# Patient Record
Sex: Female | Born: 1990 | Race: Black or African American | Hispanic: No | Marital: Married | Smoking: Never smoker
Health system: Southern US, Community
[De-identification: ages and names within clinical notes are randomized; demographics above are authoritative.]

## PROBLEM LIST (undated history)

## (undated) ENCOUNTER — Inpatient Hospital Stay (HOSPITAL_COMMUNITY): Payer: Self-pay

## (undated) DIAGNOSIS — R011 Cardiac murmur, unspecified: Secondary | ICD-10-CM

## (undated) DIAGNOSIS — Z3490 Encounter for supervision of normal pregnancy, unspecified, unspecified trimester: Secondary | ICD-10-CM

## (undated) DIAGNOSIS — R110 Nausea: Secondary | ICD-10-CM

## (undated) DIAGNOSIS — E559 Vitamin D deficiency, unspecified: Secondary | ICD-10-CM

## (undated) DIAGNOSIS — F32A Depression, unspecified: Secondary | ICD-10-CM

## (undated) DIAGNOSIS — Z8619 Personal history of other infectious and parasitic diseases: Secondary | ICD-10-CM

## (undated) DIAGNOSIS — F329 Major depressive disorder, single episode, unspecified: Secondary | ICD-10-CM

## (undated) DIAGNOSIS — F419 Anxiety disorder, unspecified: Secondary | ICD-10-CM

## (undated) HISTORY — DX: Personal history of other infectious and parasitic diseases: Z86.19

## (undated) HISTORY — DX: Depression, unspecified: F32.A

## (undated) HISTORY — DX: Anxiety disorder, unspecified: F41.9

## (undated) HISTORY — DX: Vitamin D deficiency, unspecified: E55.9

## (undated) HISTORY — DX: Major depressive disorder, single episode, unspecified: F32.9

---
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
## 2015-03-16 ENCOUNTER — Encounter (HOSPITAL_COMMUNITY): Payer: Self-pay | Admitting: Psychiatry

## 2015-03-16 ENCOUNTER — Ambulatory Visit (INDEPENDENT_AMBULATORY_CARE_PROVIDER_SITE_OTHER): Payer: 59 | Admitting: Psychiatry

## 2015-03-16 VITALS — BP 136/88 | HR 81 | Ht 67.0 in | Wt 186.4 lb

## 2015-03-16 DIAGNOSIS — F4310 Post-traumatic stress disorder, unspecified: Secondary | ICD-10-CM | POA: Diagnosis not present

## 2015-03-16 DIAGNOSIS — F902 Attention-deficit hyperactivity disorder, combined type: Secondary | ICD-10-CM | POA: Diagnosis not present

## 2015-03-16 DIAGNOSIS — F341 Dysthymic disorder: Secondary | ICD-10-CM | POA: Diagnosis not present

## 2015-03-16 MED ORDER — BUPROPION HCL ER (XL) 150 MG PO TB24: 150.0000 mg | ORAL_TABLET | Freq: Every day | ORAL | Status: DC
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
## 2015-04-06 ENCOUNTER — Ambulatory Visit (HOSPITAL_COMMUNITY): Payer: 59 | Admitting: Psychiatry

## 2015-04-27 ENCOUNTER — Ambulatory Visit (HOSPITAL_COMMUNITY): Payer: 59 | Admitting: Psychiatry

## 2015-05-05 ENCOUNTER — Ambulatory Visit (HOSPITAL_COMMUNITY): Payer: 59 | Admitting: Psychiatry

## 2015-05-06 ENCOUNTER — Encounter (HOSPITAL_COMMUNITY): Payer: Self-pay | Admitting: Psychiatry

## 2015-05-06 ENCOUNTER — Ambulatory Visit (INDEPENDENT_AMBULATORY_CARE_PROVIDER_SITE_OTHER): Payer: 59 | Admitting: Psychiatry

## 2015-05-06 VITALS — BP 114/74 | HR 62 | Ht 67.0 in | Wt 183.8 lb

## 2015-05-06 DIAGNOSIS — F341 Dysthymic disorder: Secondary | ICD-10-CM | POA: Diagnosis not present

## 2015-05-06 DIAGNOSIS — F902 Attention-deficit hyperactivity disorder, combined type: Secondary | ICD-10-CM | POA: Diagnosis not present

## 2015-05-06 DIAGNOSIS — F4310 Post-traumatic stress disorder, unspecified: Secondary | ICD-10-CM

## 2015-05-06 MED ORDER — METHYLPHENIDATE HCL ER (OSM) 18 MG PO TBCR: 18.0000 mg | EXTENDED_RELEASE_TABLET | Freq: Two times a day (BID) | ORAL | Status: DC
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
## 2015-05-20 ENCOUNTER — Ambulatory Visit (HOSPITAL_COMMUNITY): Payer: BLUE CROSS/BLUE SHIELD | Admitting: Psychiatry

## 2015-05-24 ENCOUNTER — Telehealth (HOSPITAL_COMMUNITY): Payer: Self-pay

## 2015-05-24 NOTE — Telephone Encounter (Signed)
Received a fax from [employer]  for the approval of Concerta  - approved through 05/19/2016

## 2015-06-07 ENCOUNTER — Ambulatory Visit (HOSPITAL_COMMUNITY): Payer: 59 | Admitting: Psychiatry

## 2015-06-09 ENCOUNTER — Encounter (HOSPITAL_COMMUNITY): Payer: Self-pay | Admitting: Psychiatry

## 2015-06-09 ENCOUNTER — Ambulatory Visit (INDEPENDENT_AMBULATORY_CARE_PROVIDER_SITE_OTHER): Payer: BLUE CROSS/BLUE SHIELD | Admitting: Psychiatry

## 2015-06-09 VITALS — BP 108/72 | HR 76 | Ht 67.0 in | Wt 186.2 lb

## 2015-06-09 DIAGNOSIS — F4310 Post-traumatic stress disorder, unspecified: Secondary | ICD-10-CM

## 2015-06-09 DIAGNOSIS — F902 Attention-deficit hyperactivity disorder, combined type: Secondary | ICD-10-CM

## 2015-06-09 DIAGNOSIS — F341 Dysthymic disorder: Secondary | ICD-10-CM

## 2015-06-09 MED ORDER — METHYLPHENIDATE HCL ER (OSM) 18 MG PO TBCR: 18.0000 mg | EXTENDED_RELEASE_TABLET | Freq: Two times a day (BID) | ORAL | Status: DC
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
## 2015-06-28 ENCOUNTER — Ambulatory Visit (HOSPITAL_COMMUNITY): Payer: BLUE CROSS/BLUE SHIELD | Admitting: Psychiatry

## 2015-07-12 ENCOUNTER — Ambulatory Visit (HOSPITAL_COMMUNITY): Payer: BLUE CROSS/BLUE SHIELD | Admitting: Psychiatry

## 2015-08-13 ENCOUNTER — Encounter (HOSPITAL_COMMUNITY): Payer: Self-pay | Admitting: Psychiatry

## 2015-08-13 ENCOUNTER — Encounter (HOSPITAL_COMMUNITY): Payer: Self-pay | Admitting: *Deleted

## 2015-08-13 ENCOUNTER — Emergency Department (HOSPITAL_COMMUNITY)
Admission: EM | Admit: 2015-08-13 | Discharge: 2015-08-13 | Disposition: A | Discharge status: Home / Self Care | Payer: BLUE CROSS/BLUE SHIELD | Attending: Emergency Medicine | Admitting: Emergency Medicine

## 2015-08-13 DIAGNOSIS — Y92410 Unspecified street and highway as the place of occurrence of the external cause: Secondary | ICD-10-CM | POA: Diagnosis not present

## 2015-08-13 DIAGNOSIS — Y9389 Activity, other specified: Secondary | ICD-10-CM | POA: Insufficient documentation

## 2015-08-13 DIAGNOSIS — Y998 Other external cause status: Secondary | ICD-10-CM | POA: Insufficient documentation

## 2015-08-13 DIAGNOSIS — S0990XA Unspecified injury of head, initial encounter: Secondary | ICD-10-CM | POA: Diagnosis not present

## 2015-08-13 DIAGNOSIS — S299XXA Unspecified injury of thorax, initial encounter: Secondary | ICD-10-CM | POA: Insufficient documentation

## 2015-08-13 DIAGNOSIS — M546 Pain in thoracic spine: Secondary | ICD-10-CM

## 2015-08-13 MED ORDER — METHOCARBAMOL 500 MG PO TABS: 500.0000 mg | ORAL_TABLET | Freq: Two times a day (BID) | ORAL | Status: DC

## 2015-08-13 MED ORDER — IBUPROFEN 800 MG PO TABS: 800.0000 mg | ORAL_TABLET | Freq: Three times a day (TID) | ORAL | Status: DC

## 2015-08-13 NOTE — ED Notes (Signed)
Pt. with her daughter at pediatric  [facility] .

## 2015-08-13 NOTE — ED Notes (Signed)
Unable to locate pt. at waiting area twice .
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
## 2015-09-06 ENCOUNTER — Ambulatory Visit (HOSPITAL_COMMUNITY): Payer: BLUE CROSS/BLUE SHIELD | Admitting: Psychiatry

## 2016-12-03 ENCOUNTER — Emergency Department (HOSPITAL_COMMUNITY)
Admission: EM | Admit: 2016-12-03 | Discharge: 2016-12-03 | Disposition: A | Discharge status: Home / Self Care | Payer: BLUE CROSS/BLUE SHIELD | Attending: Emergency Medicine | Admitting: Emergency Medicine

## 2016-12-03 ENCOUNTER — Encounter (HOSPITAL_COMMUNITY): Payer: Self-pay | Admitting: Emergency Medicine

## 2016-12-03 DIAGNOSIS — N912 Amenorrhea, unspecified: Secondary | ICD-10-CM

## 2016-12-03 DIAGNOSIS — N898 Other specified noninflammatory disorders of vagina: Secondary | ICD-10-CM | POA: Insufficient documentation

## 2016-12-03 DIAGNOSIS — Z79899 Other long term (current) drug therapy: Secondary | ICD-10-CM | POA: Insufficient documentation

## 2016-12-03 LAB — COMPREHENSIVE METABOLIC PANEL
ALBUMIN: 4 g/dL (ref 3.5–5.0)
ALT: 16 U/L (ref 14–54)
AST: 23 U/L (ref 15–41)
Alkaline Phosphatase: 66 U/L (ref 38–126)
Anion gap: 8 (ref 5–15)
BUN: 14 mg/dL (ref 6–20)
CHLORIDE: 105 mmol/L (ref 101–111)
CO2: 26 mmol/L (ref 22–32)
Calcium: 9.4 mg/dL (ref 8.9–10.3)
Creatinine, Ser: 0.97 mg/dL (ref 0.44–1.00)
GFR calc Af Amer: >60 mL/min (ref >60)
GLUCOSE: 81 mg/dL (ref 65–99)
POTASSIUM: 3.6 mmol/L (ref 3.5–5.1)
SODIUM: 139 mmol/L (ref 135–145)
Total Bilirubin: 0.4 mg/dL (ref 0.3–1.2)
Total Protein: 7.2 g/dL (ref 6.5–8.1)

## 2016-12-03 LAB — WET PREP, GENITAL
CLUE CELLS WET PREP: NONE SEEN
Sperm: NONE SEEN
Trich, Wet Prep: NONE SEEN
WBC WET PREP: NONE SEEN
YEAST WET PREP: NONE SEEN

## 2016-12-03 LAB — URINALYSIS, ROUTINE W REFLEX MICROSCOPIC
BACTERIA UA: NONE SEEN
Bilirubin Urine: NEGATIVE
Glucose, UA: NEGATIVE mg/dL
KETONES UR: NEGATIVE mg/dL
LEUKOCYTES UA: NEGATIVE
Nitrite: NEGATIVE
PH: 6 (ref 5.0–8.0)
Protein, ur: NEGATIVE mg/dL
Specific Gravity, Urine: 1.018 (ref 1.005–1.030)

## 2016-12-03 LAB — CBC WITH DIFFERENTIAL/PLATELET
BASOS ABS: 0 10*3/uL (ref 0.0–0.1)
BASOS PCT: 0 %
EOS ABS: 0.5 10*3/uL (ref 0.0–0.7)
EOS PCT: 5 %
HCT: 41.5 % (ref 36.0–46.0)
Hemoglobin: 14.4 g/dL (ref 12.0–15.0)
Lymphocytes Relative: 33 %
Lymphs Abs: 3.5 10*3/uL (ref 0.7–4.0)
MCH: 29.9 pg (ref 26.0–34.0)
MCHC: 34.7 g/dL (ref 30.0–36.0)
MCV: 86.3 fL (ref 78.0–100.0)
MONO ABS: 1 10*3/uL (ref 0.1–1.0)
Monocytes Relative: 10 %
Neutro Abs: 5.7 10*3/uL (ref 1.7–7.7)
Neutrophils Relative %: 52 %
PLATELETS: 300 10*3/uL (ref 150–400)
RBC: 4.81 MIL/uL (ref 3.87–5.11)
RDW: 12.5 % (ref 11.5–15.5)
WBC: 10.8 10*3/uL — AB (ref 4.0–10.5)

## 2016-12-03 LAB — I-STAT BETA HCG BLOOD, ED (MC, WL, AP ONLY)

## 2016-12-03 NOTE — ED Triage Notes (Signed)
Pt c/o vaginal discharge. Pt states she thinks she is 4 months pregnant, discharge is more than normal. Pt states she has only had negative pregnancy tests. Pt states she has symptoms similar to when she was pregnant before.

## 2016-12-03 NOTE — ED Provider Notes (Signed)
[facility]-EMERGENCY DEPT Provider Note   CSN: [id number] Arrival date & time: 12/03/16  1313     History   Chief Complaint Chief Complaint  Patient presents with  . Possible Pregnancy  . Vaginal Discharge    HPI Jill Aguilar is a 26 y.o. female.  HPI  Jill Aguilar is a 26 y.o. female with a history of anxiety and depression, presents to emergency department complaining of abdominal pain, nausea, vaginal discharge,breast tenderness. Patient states she had all of these symptoms when she was pregnant with her daughter. She states she took a pregnancy test at home and was negative. She reports that her last normal menstrual cycle was in April and she believes she might be 4 months pregnant. She reports intermittent abdominal cramping. No bleeding. No urinary symptoms. No fever, chills, malaise. Nothing making her symptoms better or worse. No other complaints. Does not have PCP or OB/GYN  Past Medical History:  Diagnosis Date  . Anxiety   . Depression     Patient Active Problem List   Diagnosis Date Noted  . 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 PTSD (post-traumatic stress disorder) 03/16/2015  . 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 CESAREAN SECTION    . WISDOM TOOTH EXTRACTION Bilateral 10/2002    OB History    Gravida Para Term Preterm AB Living   0 0 0 0 0     SAB TAB Ectopic Multiple Live Births   0 0 0           Home Medications    Prior to Admission medications   Medication Sig Start Date End Date Taking? Authorizing Provider  ibuprofen (ADVIL,MOTRIN) 800 MG tablet Take 1 tablet (800 mg total) by mouth 3 (three) times daily. 08/13/15   Elpidio Anis, PA-C  methocarbamol (ROBAXIN) 500 MG tablet Take 1 tablet (500 mg total) by mouth 2 (two) times daily. 08/13/15   Elpidio Anis, PA-C  methylphenidate (CONCERTA) 18 MG PO CR tablet Take 1 tablet (18 mg total) by  mouth 2 (two) times daily with breakfast and lunch. 06/09/15 06/08/16  Gayland Curry, MD    Family History History reviewed. No pertinent family history.  Social History Social History  Substance Use Topics  . Smoking status: Never Smoker  . Smokeless tobacco: Never Used  . Alcohol use 3.6 oz/week    2 Glasses of wine, 2 Cans of beer, 2 Shots of liquor per week     Allergies   Patient has no known allergies.   Review of Systems Review of Systems  Constitutional: Negative for chills and fever.  Respiratory: Negative for cough, chest tightness and shortness of breath.   Cardiovascular: Negative for chest pain, palpitations and leg swelling.  Gastrointestinal: Positive for abdominal pain and nausea. Negative for diarrhea and vomiting.  Genitourinary: Positive for pelvic pain and vaginal discharge. Negative for dysuria, flank pain, vaginal bleeding and vaginal pain.  Musculoskeletal: Negative for arthralgias, myalgias, neck pain and neck stiffness.  Skin: Negative for rash.  Neurological: Negative for dizziness, weakness and headaches.  All other systems reviewed and are negative.    Physical Exam Updated Vital Signs BP (!) 143/87 (BP Location: Left Arm)   Pulse 89   Temp 97.8 F (36.6 C) (Oral)   Resp 16   LMP 07/18/2016 (Exact Date)   SpO2 99%   Physical Exam  Constitutional: She appears well-developed and well-nourished. No distress.  HENT:  Head: Normocephalic.  Eyes: Conjunctivae are normal.  Neck: Neck supple.  Cardiovascular: Normal rate, regular rhythm and normal heart sounds.   Pulmonary/Chest: Effort normal and breath sounds normal. No respiratory distress. She has no wheezes. She has no rales.  Abdominal: Soft. Bowel sounds are normal. She exhibits no distension. There is no tenderness. There is no rebound.  Genitourinary:  Genitourinary Comments: Normal external genitalia. Normal vaginal canal. Small thin white discharge. Cervix is normal, closed.  No CMT. No uterine or adnexal tenderness. No masses palpated.    Musculoskeletal: She exhibits no edema.  Neurological: She is alert.  Skin: Skin is warm and dry.  Psychiatric: She has a normal mood and affect. Her behavior is normal.  Nursing note and vitals reviewed.    ED Treatments / Results  Labs (all labs ordered are listed, but only abnormal results are displayed) Labs Reviewed  CBC WITH DIFFERENTIAL/PLATELET - Abnormal; Notable for the following:       Result Value   WBC 10.8 (*)    All other components within normal limits  URINALYSIS, ROUTINE W REFLEX MICROSCOPIC - Abnormal; Notable for the following:    Hgb urine dipstick MODERATE (*)    Squamous Epithelial / LPF 0-5 (*)    All other components within normal limits  WET PREP, GENITAL  COMPREHENSIVE METABOLIC PANEL  I-STAT BETA HCG BLOOD, ED ([facility], [facility], [facility] ONLY)  GC/CHLAMYDIA PROBE AMP ([place]) NOT AT [facility]    EKG  EKG Interpretation None       Radiology No results found.  Procedures Procedures (including critical care time)  Medications Ordered in ED Medications - No data to display   Initial Impression / Assessment and Plan / ED Course  I have reviewed the triage vital signs and the nursing notes.  Pertinent labs & imaging results that were available during my care of the patient were reviewed by me and considered in my medical decision making (see chart for details).     Into the emergency department with multiple complaints. She believes she maybe 4 months pregnant because she has not had a period since April. She reports some abdominal cramping that is intermittent, reports nausea, breast tenderness, she feels fluttering in her stomach. Patient has a laptop with her and pulls out a list of her symptoms that she typed out. I will check pregnancy test, basic labs perform pelvic exam.  5:13 PM Patient is not pregnant. Her pelvic exam is unremarkable. Labs are all normal. Urinalysis normal.discussed  test results with patient. Will have her follow with family doctor for further evaluation and treatment of her symptoms. At time of discharge patient is in no acute distress, vital signs are normal.  Vitals:   12/03/16 1410 12/03/16 1627  BP: (!) 143/87 126/80  Pulse: 89 75  Resp: 16 16  Temp: 97.8 F (36.6 C)   TempSrc: Oral   SpO2: 99% 98%     Final Clinical Impressions(s) / ED Diagnoses   Final diagnoses:  Amenorrhea  Vaginal discharge    New Prescriptions New Prescriptions   No medications on file     Jaynie Crumble, Cordelia Poche 12/03/16 1727    Pricilla Loveless, MD 12/05/16 1222

## 2016-12-03 NOTE — Discharge Instructions (Signed)
Your blood pregnancy levels are negative today. Your lab work and exam are unremarkable. Follow up as needed. Return if worsening.

## 2016-12-04 LAB — GC/CHLAMYDIA PROBE AMP (~~LOC~~) NOT AT ARMC
Chlamydia: NEGATIVE
Neisseria Gonorrhea: NEGATIVE

## 2017-02-11 ENCOUNTER — Emergency Department
Admission: EM | Admit: 2017-02-11 | Discharge: 2017-02-11 | Disposition: A | Discharge status: Home / Self Care | Payer: Self-pay | Attending: Emergency Medicine | Admitting: Emergency Medicine

## 2017-02-11 ENCOUNTER — Emergency Department: Payer: Self-pay

## 2017-02-11 ENCOUNTER — Encounter: Payer: Self-pay | Admitting: Radiology

## 2017-02-11 DIAGNOSIS — F902 Attention-deficit hyperactivity disorder, combined type: Secondary | ICD-10-CM | POA: Insufficient documentation

## 2017-02-11 DIAGNOSIS — R1012 Left upper quadrant pain: Secondary | ICD-10-CM | POA: Insufficient documentation

## 2017-02-11 DIAGNOSIS — F329 Major depressive disorder, single episode, unspecified: Secondary | ICD-10-CM | POA: Insufficient documentation

## 2017-02-11 DIAGNOSIS — R52 Pain, unspecified: Secondary | ICD-10-CM

## 2017-02-11 DIAGNOSIS — F419 Anxiety disorder, unspecified: Secondary | ICD-10-CM | POA: Insufficient documentation

## 2017-02-11 DIAGNOSIS — K8010 Calculus of gallbladder with chronic cholecystitis without obstruction: Secondary | ICD-10-CM | POA: Insufficient documentation

## 2017-02-11 DIAGNOSIS — Z79899 Other long term (current) drug therapy: Secondary | ICD-10-CM | POA: Insufficient documentation

## 2017-02-11 LAB — URINALYSIS, COMPLETE (UACMP) WITH MICROSCOPIC
Bacteria, UA: NONE SEEN
Bilirubin Urine: NEGATIVE
GLUCOSE, UA: NEGATIVE mg/dL
Ketones, ur: NEGATIVE mg/dL
LEUKOCYTES UA: NEGATIVE
NITRITE: NEGATIVE
PROTEIN: NEGATIVE mg/dL
SPECIFIC GRAVITY, URINE: 1.023 (ref 1.005–1.030)
pH: 6 (ref 5.0–8.0)

## 2017-02-11 LAB — COMPREHENSIVE METABOLIC PANEL
ALT: 16 U/L (ref 14–54)
AST: 20 U/L (ref 15–41)
Albumin: 4 g/dL (ref 3.5–5.0)
Alkaline Phosphatase: 64 U/L (ref 38–126)
Anion gap: 10 (ref 5–15)
BILIRUBIN TOTAL: 0.6 mg/dL (ref 0.3–1.2)
BUN: 16 mg/dL (ref 6–20)
CALCIUM: 9.3 mg/dL (ref 8.9–10.3)
CHLORIDE: 105 mmol/L (ref 101–111)
CO2: 24 mmol/L (ref 22–32)
CREATININE: 0.84 mg/dL (ref 0.44–1.00)
Glucose, Bld: 109 mg/dL — ABNORMAL HIGH (ref 65–99)
Potassium: 3.8 mmol/L (ref 3.5–5.1)
Sodium: 139 mmol/L (ref 135–145)
TOTAL PROTEIN: 7.9 g/dL (ref 6.5–8.1)

## 2017-02-11 LAB — CBC
HCT: 43.3 % (ref 35.0–47.0)
Hemoglobin: 14.3 g/dL (ref 12.0–16.0)
MCH: 29.2 pg (ref 26.0–34.0)
MCHC: 33 g/dL (ref 32.0–36.0)
MCV: 88.6 fL (ref 80.0–100.0)
PLATELETS: 335 10*3/uL (ref 150–440)
RBC: 4.88 MIL/uL (ref 3.80–5.20)
RDW: 13.1 % (ref 11.5–14.5)
WBC: 11.8 10*3/uL — AB (ref 3.6–11.0)

## 2017-02-11 LAB — POCT PREGNANCY, URINE: Preg Test, Ur: NEGATIVE

## 2017-02-11 LAB — LIPASE, BLOOD: LIPASE: 25 U/L (ref 11–51)

## 2017-02-11 MED ORDER — FAMOTIDINE 20 MG PO TABS: 20.0000 mg | ORAL_TABLET | Freq: Two times a day (BID) | ORAL | 0 refills | Status: DC

## 2017-02-11 MED ORDER — HYDROCOD POLST-CPM POLST ER 10-8 MG/5ML PO SUER
5.0000 mL | Freq: Once | ORAL | Status: AC
  Administered 2017-02-11: 5 mL via ORAL
  Filled 2017-02-11: qty 5

## 2017-02-11 MED ORDER — GI COCKTAIL ~~LOC~~
30.0000 mL | Freq: Once | ORAL | Status: AC
  Administered 2017-02-11: 30 mL via ORAL
  Filled 2017-02-11: qty 30

## 2017-02-11 MED ORDER — KETOROLAC TROMETHAMINE 30 MG/ML IJ SOLN
15.0000 mg | Freq: Once | INTRAMUSCULAR | Status: AC
  Administered 2017-02-11: 15 mg via INTRAVENOUS
  Filled 2017-02-11: qty 1

## 2017-02-11 MED ORDER — IOPAMIDOL (ISOVUE-300) INJECTION 61%
100.0000 mL | Freq: Once | INTRAVENOUS | Status: AC | PRN
  Administered 2017-02-11: 100 mL via INTRAVENOUS

## 2017-02-11 MED ORDER — HYDROCODONE-ACETAMINOPHEN 5-325 MG PO TABS
1.0000 | ORAL_TABLET | Freq: Once | ORAL | Status: AC
  Administered 2017-02-11: 1 via ORAL
  Filled 2017-02-11: qty 1

## 2017-02-11 NOTE — ED Provider Notes (Signed)
[facility] Emergency Department Provider Note  ____________________________________________   First MD Initiated Contact with Patient 02/11/17 1101     (approximate)  I have reviewed the triage vital signs and the nursing notes.   HISTORY  Chief Complaint Abdominal Pain   HPI Jill Aguilar is a 26 y.o. female who self presents to the emergency department with multiple complaints.  She is primarily asking for a pregnancy test because she is concerned she may be pregnant.  For the past several months she has had intermittent symptoms including breast fullness, chest pain, vaginal spotting, and left flank pain.  She has had multiple negative pregnancy tests and has seen several physicians who have always reported negative pregnancy test.  She has a past medical history of polycystic ovarian syndrome diagnosed via biopsy as a teenager but is currently not being treated.  Ever since her symptoms began she has not had an ultrasound or CT scan.  She comes to the emergency department today primarily with 2 issues.  The first is constant aching and throbbing left flank pain.  She also has intermittent epigastric and lower chest discomfort.  She does have a long-standing history of gastric reflux, however this time it is not responding to her normal treatments.  Her flank pain is constant moderate cramping.  Nothing seems to make it better or worse.  Past Medical History:  Diagnosis Date  . Anxiety   . Depression     Patient Active Problem List   Diagnosis Date Noted  . 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 PTSD (post-traumatic stress disorder) 03/16/2015  . 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 CESAREAN SECTION    . WISDOM TOOTH EXTRACTION Bilateral 10/2002    Prior to Admission medications   Medication Sig Start Date End Date Taking? Authorizing Provider  [facility] JOHNS  WORT PO Take 1 capsule by mouth daily.   Yes [provider]  famotidine (PEPCID) 20 MG tablet Take 1 tablet (20 mg total) by mouth 2 (two) times daily. 02/11/17 02/11/18  Merrily Brittleifenbark, Keaunna Skipper, MD  ibuprofen (ADVIL,MOTRIN) 800 MG tablet Take 1 tablet (800 mg total) by mouth 3 (three) times daily. Patient not taking: Reported on 02/11/2017 08/13/15   Elpidio AnisUpstill, Shari, PA-C  methocarbamol (ROBAXIN) 500 MG tablet Take 1 tablet (500 mg total) by mouth 2 (two) times daily. Patient not taking: Reported on 02/11/2017 08/13/15   Elpidio AnisUpstill, Shari, PA-C  methylphenidate (CONCERTA) 18 MG PO CR tablet Take 1 tablet (18 mg total) by mouth 2 (two) times daily with breakfast and lunch. Patient not taking: Reported on 02/11/2017 06/09/15 06/08/16  Gayland Curryadepalli, Gayathri D, MD    Allergies Patient has no known allergies.  No family history on file.  Social History Social History  Substance Use Topics  . Smoking status: Never Smoker  . Smokeless tobacco: Never Used  . Alcohol use 3.6 oz/week    2 Glasses of wine, 2 Cans of beer, 2 Shots of liquor per week    Review of Systems Constitutional: No fever/chills Eyes: No visual changes. ENT: No sore throat. Cardiovascular: Positive for chest pain. Respiratory: Denies shortness of breath. Gastrointestinal: Positive for abdominal pain.  Positive for nausea, no vomiting.  No diarrhea.  No constipation. Genitourinary: Negative for dysuria. Musculoskeletal: Negative for back pain. Skin: Negative for rash. Neurological: Negative for headaches, focal weakness or numbness.   ____________________________________________   PHYSICAL EXAM:  VITAL SIGNS: ED Triage Vitals  Enc Vitals Group     BP 02/11/17 0903 (!) 141/93     Pulse Rate 02/11/17 0903 87     Resp 02/11/17 0903 16     Temp 02/11/17 0903 98.7 F (37.1 C)     Temp Source 02/11/17 0903 Oral     SpO2 02/11/17 0903 100 %     Weight 02/11/17 0903 190 lb (86.2 kg)     Height 02/11/17 0903 5\' 7"   (1.702 m)     Head Circumference --      Peak Flow --      Pain Score 02/11/17 0911 8     Pain Loc --      Pain Edu? --      Excl. in GC? --     Constitutional: Alert and oriented x4 appears somewhat uncomfortable splinting her left flank no diaphoresis speaks in full clear sentences Eyes: PERRL EOMI. Head: Atraumatic. Nose: No congestion/rhinnorhea. Mouth/Throat: No trismus Neck: No stridor.   Cardiovascular: Normal rate, regular rhythm. Grossly normal heart sounds.  Good peripheral circulation. Respiratory: Normal respiratory effort.  No retractions. Lungs CTAB and moving good air Gastrointestinal: Soft nondistended she is tender in her left upper quadrant along the left flank with no rebound or guarding or peritonitis Musculoskeletal: No lower extremity edema   Neurologic:  Normal speech and language. No gross focal neurologic deficits are appreciated. Skin:  Skin is warm, dry and intact. No rash noted. Psychiatric: Mood and affect are normal. Speech and behavior are normal.    ____________________________________________   DIFFERENTIAL includes but not limited to  Kidney stone, pyelonephritis, polycystic ovarian syndrome, pneumonia ____________________________________________   LABS (all labs ordered are listed, but only abnormal results are displayed)  Labs Reviewed  COMPREHENSIVE METABOLIC PANEL - Abnormal; Notable for the following:       Result Value   Glucose, Bld 109 (*)    All other components within normal limits  CBC - Abnormal; Notable for the following:    WBC 11.8 (*)    All other components within normal limits  URINALYSIS, COMPLETE (UACMP) WITH MICROSCOPIC - Abnormal; Notable for the following:    Color, Urine YELLOW (*)    APPearance CLEAR (*)    Hgb urine dipstick MODERATE (*)    Squamous Epithelial / LPF 0-5 (*)    All other components within normal limits  LIPASE, BLOOD  POC URINE PREG, ED  POCT PREGNANCY, URINE    Blood work reviewed and  interpreted by me with no evidence of urinary tract infection and not pregnant __________________________________________  EKG  ED ECG REPORT I, Merrily Brittle, the attending physician, personally viewed and interpreted this ECG.  Date: 02/11/2017 EKG Time:  Rate: 90 Rhythm: normal sinus rhythm QRS Axis: normal Intervals: normal ST/T Wave abnormalities: normal Narrative Interpretation: no evidence of acute ischemia  ____________________________________________  RADIOLOGY  CT scan abdomen pelvis concerning for acute cholecystitis Ultrasound right upper quadrant reviewed by me concerning for acute cholecystitis ____________________________________________   PROCEDURES  Procedure(s) performed: no  Procedures  Critical Care performed: no  Observation: no ____________________________________________   INITIAL IMPRESSION / ASSESSMENT AND PLAN / ED COURSE  Pertinent labs & imaging results that were available during my care of the patient were reviewed by me and considered in my medical decision making (see chart for details).  The patient arrives hemodynamically stable although somewhat uncomfortable appearing.  Her symptoms are subacute, however are clearly worsening.  She is quite tender in her left upper quadrant and left flank  raising concern for undiagnosed nephrolithiasis versus other intra-abdominal pathology.  At this point the patient requires a CT scan with IV contrast in addition to a chest x-ray for her atypical chest pain.     ----------------------------------------- 1:11 PM on 02/11/2017 ----------------------------------------- The patient feels improved.  Her CT scan is back concerning actually for acute cholecystitis.  Her clinical picture does not fit this with left upper quadrant pain that is been chronic for some time.  I had a lengthy discussion with the patient regarding the need for right upper quadrant ultrasound today and she agrees to stay.  I am  not consulting surgery now as she is afebrile, pain controlled, normal labs, and has left upper quadrant pain.  __________________________  ----------------------------------------- 2:34 PM on 02/11/2017 -----------------------------------------  The patient's ultrasound has a negative Murphy's but does have gallbladder wall thickening and is concerning for possible cholecystitis.  I have touched base with on-call general surgeon Dr. Earlene Plater who will kindly come evaluate the patient now.  __________________    ----------------------------------------- 3:19 PM on 02/11/2017 -----------------------------------------  Dr. Earlene Plater evaluated the patient feels that the patient does not have acute cholecystitis and that her symptoms are likely related to reflux.  The imaging is likely incidental.  Patient is able to eat and drink without difficulty.  Strict return precautions have been given and the patient is discharged home in improved condition.  She verbalizes understanding and agreement the plan.  FINAL CLINICAL IMPRESSION(S) / ED DIAGNOSES  Final diagnoses:  Pain  Left upper quadrant pain      NEW MEDICATIONS STARTED DURING THIS VISIT:  New Prescriptions   FAMOTIDINE (PEPCID) 20 MG TABLET    Take 1 tablet (20 mg total) by mouth 2 (two) times daily.     Note:  This document was prepared using Dragon voice recognition software and may include unintentional dictation errors.     Merrily Brittle, MD 02/11/17 [phone]

## 2017-02-11 NOTE — Consult Note (Signed)
SURGICAL CONSULTATION NOTE (initial) - cpt: 14782  HISTORY OF PRESENT ILLNESS (HPI):  26 y.o. female presented to [facility] ED for evaluation of Left back >> epigastric abdominal pain x 10 days. Patient reports a history of chronic GERD that she has historically self-treated with baking soda solution, but that did not provide her relief during this episode. She presented to an urgent care facility, which advised omeprazole, but patient said she doesn't like taking medications and has not yet tried this management. Otherwise, patient states her Left back and epigastric pain are both tolerable during the day and worse when she lays down at night, becomes worst ~3:30 am every night, unrelated to eating or what she eats/drinks. She clearly and consistently strongly denies RUQ abdominal pain or association with fatty foods. She also reports +flatus and +BM, denies N/V, fever/chills, CP, or SOB.  Surgery is consulted by ED physician Dr. Lamont Snowball in this context for evaluation and management of cholelithiasis.  PAST MEDICAL HISTORY (PMH):  Past Medical History:  Diagnosis Date  . Anxiety   . Depression      PAST SURGICAL HISTORY (PSH):  Past Surgical History:  Procedure Laterality Date  . CESAREAN SECTION    . WISDOM TOOTH EXTRACTION Bilateral 10/2002     MEDICATIONS:  Prior to Admission medications   Medication Sig Start Date End Date Taking? Authorizing Provider  [facility] J[facility] WORT PO Take 1 capsule by mouth daily.   Yes [provider]  famotidine (PEPCID) 20 MG tablet Take 1 tablet (20 mg total) by mouth 2 (two) times daily. 02/11/17 02/11/18  Merrily Brittle, MD  ibuprofen (ADVIL,MOTRIN) 800 MG tablet Take 1 tablet (800 mg total) by mouth 3 (three) times daily. Patient not taking: Reported on 02/11/2017 08/13/15   Elpidio Anis, PA-C  methocarbamol (ROBAXIN) 500 MG tablet Take 1 tablet (500 mg total) by mouth 2 (two) times daily. Patient not taking: Reported on 02/11/2017 08/13/15    Elpidio Anis, PA-C  methylphenidate (CONCERTA) 18 MG PO CR tablet Take 1 tablet (18 mg total) by mouth 2 (two) times daily with breakfast and lunch. Patient not taking: Reported on 02/11/2017 06/09/15 06/08/16  Gayland Curry, MD     ALLERGIES:  No Known Allergies   SOCIAL HISTORY:  Social History   Social History  . Marital status: Single    Spouse name: N/A  . Number of children: N/A  . Years of education: N/A   Occupational History  . Not on file.   Social History Main Topics  . Smoking status: Never Smoker  . Smokeless tobacco: Never Used  . Alcohol use 3.6 oz/week    2 Glasses of wine, 2 Cans of beer, 2 Shots of liquor per week  . Drug use: Yes    Frequency: 1.0 time per week    Types: Marijuana  . Sexual activity: Yes    Birth control/ protection: None   Other Topics Concern  . Not on file   Social History Narrative   ** Merged History Encounter **        The patient currently resides (home / rehab facility / nursing home): Home The patient normally is (ambulatory / bedbound): Ambulatory   FAMILY HISTORY:  No family history on file.   REVIEW OF SYSTEMS:  Constitutional: denies weight loss, fever, chills, or sweats  Eyes: denies any other vision changes, history of eye injury  ENT: denies sore throat, hearing problems  Respiratory: denies shortness of breath, wheezing  Cardiovascular: denies chest pain, palpitations  Gastrointestinal: abdominal pain, N/V, and bowel function as per HPI Genitourinary: denies burning with urination or urinary frequency Musculoskeletal: denies any other joint pains or cramps  Skin: denies any other rashes or skin discolorations  Neurological: denies any other headache, dizziness, weakness  Psychiatric: denies any other depression, anxiety   All other review of systems were negative   VITAL SIGNS:  Temp:  [98.7 F (37.1 C)] 98.7 F (37.1 C) (10/28 0903) Pulse Rate:  [61-137] 61 (10/28 1530) Resp:  [16-18] 18  (10/28 1430) BP: (124-141)/(76-107) 125/88 (10/28 1530) SpO2:  [82 %-100 %] 100 % (10/28 1530) Weight:  [190 lb (86.2 kg)] 190 lb (86.2 kg) (10/28 0903)     Height: 5\' 7"  (170.2 cm) Weight: 190 lb (86.2 kg) BMI (Calculated): 29.75   INTAKE/OUTPUT:  This shift: No intake/output data recorded.  Last 2 shifts: @IOLAST2SHIFTS @   PHYSICAL EXAM:  Constitutional:  -- Obese body habitus  -- Awake, alert, and oriented x3  Eyes:  -- Pupils equally round and reactive to light  -- No scleral icterus  Ear, nose, and throat:  -- No jugular venous distension  Pulmonary:  -- No crackles  -- Equal breath sounds bilaterally -- Breathing non-labored at rest Cardiovascular:  -- S1, S2 present  -- No pericardial rubs Gastrointestinal:  -- Abdomen soft, obese, and non-distended with mild epigastric abdominal tenderness to palpation, no guarding or rebound tenderness -- No abdominal masses appreciated, pulsatile or otherwise  Musculoskeletal and Integumentary:  -- Wounds or skin discoloration: None appreciated -- Extremities: B/L UE and LE FROM, hands and feet warm, no edema  Neurologic:  -- Motor function: intact and symmetric -- Sensation: intact and symmetric  Labs:  CBC Latest Ref Rng & Units 02/11/2017 12/03/2016  WBC 3.6 - 11.0 K/uL 11.8(H) 10.8(H)  Hemoglobin 12.0 - 16.0 g/dL 16.114.3 09.614.4  Hematocrit 04.535.0 - 47.0 % 43.3 41.5  Platelets 150 - 440 K/uL 335 300   CMP Latest Ref Rng & Units 02/11/2017 12/03/2016  Glucose 65 - 99 mg/dL 409(W109(H) 81  BUN 6 - 20 mg/dL 16 14  Creatinine 1.190.44 - 1.00 mg/dL 1.470.84 8.290.97  Sodium 562135 - 145 mmol/L 139 139  Potassium 3.5 - 5.1 mmol/L 3.8 3.6  Chloride 101 - 111 mmol/L 105 105  CO2 22 - 32 mmol/L 24 26  Calcium 8.9 - 10.3 mg/dL 9.3 9.4  Total Protein 6.5 - 8.1 g/dL 7.9 7.2  Total Bilirubin 0.3 - 1.2 mg/dL 0.6 0.4  Alkaline Phos 38 - 126 U/L 64 66  AST 15 - 41 U/L 20 23  ALT 14 - 54 U/L 16 16   Imaging studies:  Limited RUQ Abdominal Ultrasound  (02/11/2017) Cholelithiasis and gallbladder wall thickening. Minimal pericholecystic fluid not excluded. No Murphy's sign. Acute cholecystitis is not excluded. If there is clinical ambiguity, recommend a HIDA scan.  CT Abdomen and Pelvis with Contrast (02/11/2017) No hepatic masses identified. Multiple cholesterol gallstones are seen and there is diffuse gallbladder wall thickening and mucosal enhancement, highly suspicious for acute cholecystitis. No evidence of biliary ductal dilatation.  Assessment/Plan: (ICD-10's: K80.20 vs K80.10) 26 y.o. female with uncontrolled GERD associated with Left back pain and asymptomatic cholelithiasis vs chronic cholecystitis, complicated by pertinent comorbidities including obesity (BMI 30), generalized anxiety disorder, and major depression disorder.   - agree with PPI, discussed with patient, who agrees to trial   - considering lack of symptoms consistent with acute cholecystitis, no indication for emergent cholecystectomy  - however, outpatient surgery follow-up this week advised  considering imaging findings, mild leukocytosis, and non-post-prandial epigastric component of patient's symptoms  - avoid fatty foods and morphine derivatives (morphine, dilaudid, oxycodone) if found to make pain worse  All of the above findings and recommendations were discussed with the patient and her mom (bedside), and all of patient's and her family's questions were answered to their expressed satisfaction.  Thank you for the opportunity to participate in this patient's care.   -- Scherrie Gerlach Earlene Plater, MD, RPVI [place]: [facility] General Surgery - Partnering for exceptional care. Office: [phone]

## 2017-02-11 NOTE — ED Triage Notes (Signed)
PT reports having symptoms of pregnancy off and on since May, she has had multiple negative preg tests. Pt states she still feels like the same symptoms she had when she was pregnant before. She c/o left side pain, epigastric pain, mid back pain. Pt has PCOS. Pt states she has heartburn, but no meds are helping at this time. Pt has [facility] and by PCP. Pt states she wants an US.  Pt states she had blood in her urine before. No prior hx of kidney stone.

## 2017-02-11 NOTE — Discharge Instructions (Signed)
Please take your famotidine twice a day for the next month.  Please follow-up with primary care in 2 days for recheck of your abdomen.  Return to the emergency department sooner for any concerns such as fevers, chills, worsening pain, if you are unable to eat or drink, or for any other issues whatsoever.  It was a pleasure to take care of you today, and thank you for coming to our emergency department.  If you have any questions or concerns before leaving please ask the nurse to grab me and I'm more than happy to go through your aftercare instructions again.  If you were prescribed any opioid pain medication today such as Norco, Vicodin, Percocet, morphine, hydrocodone, or oxycodone please make sure you do not drive when you are taking this medication as it can alter your ability to drive safely.  If you have any concerns once you are home that you are not improving or are in fact getting worse before you can make it to your follow-up appointment, please do not hesitate to call [phone] and come back for further evaluation.  Merrily Brittle, MD  Results for orders placed or performed during the hospital encounter of 02/11/17  Lipase, blood  Result Value Ref Range   Lipase 25 11 - 51 U/L  Comprehensive metabolic panel  Result Value Ref Range   Sodium 139 135 - 145 mmol/L   Potassium 3.8 3.5 - 5.1 mmol/L   Chloride 105 101 - 111 mmol/L   CO2 24 22 - 32 mmol/L   Glucose, Bld 109 (H) 65 - 99 mg/dL   BUN 16 6 - 20 mg/dL   Creatinine, Ser 1.61 0.44 - 1.00 mg/dL   Calcium 9.3 8.9 - 09.6 mg/dL   Total Protein 7.9 6.5 - 8.1 g/dL   Albumin 4.0 3.5 - 5.0 g/dL   AST 20 15 - 41 U/L   ALT 16 14 - 54 U/L   Alkaline Phosphatase 64 38 - 126 U/L   Total Bilirubin 0.6 0.3 - 1.2 mg/dL   GFR calc non Af Amer >60 >60 mL/min   GFR calc Af Amer >60 >60 mL/min   Anion gap 10 5 - 15  CBC  Result Value Ref Range   WBC 11.8 (H) 3.6 - 11.0 K/uL   RBC 4.88 3.80 - 5.20 MIL/uL   Hemoglobin 14.3 12.0 - 16.0 g/dL   HCT 04.5 40.9 - 81.1 %   MCV 88.6 80.0 - 100.0 fL   MCH 29.2 26.0 - 34.0 pg   MCHC 33.0 32.0 - 36.0 g/dL   RDW 91.4 78.2 - 95.6 %   Platelets 335 150 - 440 K/uL  Urinalysis, Complete w Microscopic  Result Value Ref Range   Color, Urine YELLOW (A) YELLOW   APPearance CLEAR (A) CLEAR   Specific Gravity, Urine 1.023 1.005 - 1.030   pH 6.0 5.0 - 8.0   Glucose, UA NEGATIVE NEGATIVE mg/dL   Hgb urine dipstick MODERATE (A) NEGATIVE   Bilirubin Urine NEGATIVE NEGATIVE   Ketones, ur NEGATIVE NEGATIVE mg/dL   Protein, ur NEGATIVE NEGATIVE mg/dL   Nitrite NEGATIVE NEGATIVE   Leukocytes, UA NEGATIVE NEGATIVE   RBC / HPF 6-30 0 - 5 RBC/hpf   WBC, UA 0-5 0 - 5 WBC/hpf   Bacteria, UA NONE SEEN NONE SEEN   Squamous Epithelial / LPF 0-5 (A) NONE SEEN   Mucus PRESENT    Budding Yeast PRESENT    Amorphous Crystal PRESENT   Pregnancy, urine POC  Result  Value Ref Range   Preg Test, Ur NEGATIVE NEGATIVE   Ct Abdomen Pelvis W Contrast  Result Date: 02/11/2017 CLINICAL DATA:  Epigastric and left-sided abdominal pain. EXAM: CT ABDOMEN AND PELVIS WITH CONTRAST TECHNIQUE: Multidetector CT imaging of the abdomen and pelvis was performed using the standard protocol following bolus administration of intravenous contrast. CONTRAST:  100mL ISOVUE-300 IOPAMIDOL (ISOVUE-300) INJECTION 61% COMPARISON:  None. FINDINGS: Lower Chest: No acute findings. Hepatobiliary: No hepatic masses identified. Multiple cholesterol gallstones are seen and there is diffuse gallbladder wall thickening and mucosal enhancement, highly suspicious for acute cholecystitis. No evidence of biliary ductal dilatation. Pancreas:  No mass or inflammatory changes. Spleen: Within normal limits in size and appearance. Adrenals/Urinary Tract: No masses identified. No evidence of hydronephrosis. Stomach/Bowel: No evidence of obstruction, inflammatory process or abnormal fluid collections. Normal appendix visualized. Vascular/Lymphatic: No pathologically  enlarged lymph nodes. No abdominal aortic aneurysm. Reproductive: Normal appearance of uterus and ovaries. Tiny amount of free fluid in pelvic cul-de-sac, likely physiologic. Other:  None. Musculoskeletal:  No suspicious bone lesions identified. IMPRESSION: Gallstones and diffuse gallbladder wall thickening, highly suspicious for acute cholecystitis. Electronically Signed   By: Myles RosenthalJohn  Stahl M.D.   On: 02/11/2017 12:57   Dg Chest Port 1 View  Result Date: 02/11/2017 CLINICAL DATA:  Chest and epigastric pain for 5 months. EXAM: PORTABLE CHEST 1 VIEW COMPARISON:  None. FINDINGS: The heart size and mediastinal contours are within normal limits. Both lungs are clear. No evidence of pneumothorax or pleural effusion. The visualized skeletal structures are unremarkable. IMPRESSION: No active disease. Electronically Signed   By: Myles RosenthalJohn  Stahl M.D.   On: 02/11/2017 12:16   [place] Abdomen Limited Ruq  Result Date: 02/11/2017 CLINICAL DATA:  Two days of pain. EXAM: ULTRASOUND ABDOMEN LIMITED RIGHT UPPER QUADRANT COMPARISON:  CT imaging earlier today FINDINGS: Gallbladder: The gallbladder is filled with stones with wall thickening measuring up to 6.4 mm. Minimal pericholecystic fluid not excluded. No Murphy's sign. Common bile duct: Diameter: 3.5 mm. Liver: No focal lesion identified. Within normal limits in parenchymal echogenicity. Portal vein is patent on color Doppler imaging with normal direction of blood flow towards the liver. IMPRESSION: 1. Cholelithiasis and gallbladder wall thickening. Minimal pericholecystic fluid not excluded. No Murphy's sign. Acute cholecystitis is not excluded. If there is clinical ambiguity, recommend a HIDA scan. Electronically Signed   By: Gerome Samavid  Williams III M.D   On: 02/11/2017 14:16

## 2017-02-15 ENCOUNTER — Encounter (HOSPITAL_COMMUNITY): Payer: Self-pay | Admitting: Emergency Medicine

## 2017-02-15 ENCOUNTER — Inpatient Hospital Stay (HOSPITAL_COMMUNITY)
Admission: EM | Admit: 2017-02-15 | Discharge: 2017-02-20 | DRG: 832 | Disposition: A | Discharge status: Home / Self Care | Payer: Medicaid Other | Attending: Surgery | Admitting: Surgery

## 2017-02-15 DIAGNOSIS — O99321 Drug use complicating pregnancy, first trimester: Secondary | ICD-10-CM | POA: Diagnosis present

## 2017-02-15 DIAGNOSIS — K8020 Calculus of gallbladder without cholecystitis without obstruction: Secondary | ICD-10-CM | POA: Diagnosis present

## 2017-02-15 DIAGNOSIS — O99341 Other mental disorders complicating pregnancy, first trimester: Secondary | ICD-10-CM | POA: Diagnosis present

## 2017-02-15 DIAGNOSIS — Z3A01 Less than 8 weeks gestation of pregnancy: Secondary | ICD-10-CM

## 2017-02-15 DIAGNOSIS — F419 Anxiety disorder, unspecified: Secondary | ICD-10-CM | POA: Diagnosis present

## 2017-02-15 DIAGNOSIS — F1290 Cannabis use, unspecified, uncomplicated: Secondary | ICD-10-CM | POA: Diagnosis present

## 2017-02-15 DIAGNOSIS — R7303 Prediabetes: Secondary | ICD-10-CM | POA: Diagnosis present

## 2017-02-15 DIAGNOSIS — O9989 Other specified diseases and conditions complicating pregnancy, childbirth and the puerperium: Secondary | ICD-10-CM | POA: Diagnosis present

## 2017-02-15 DIAGNOSIS — K8010 Calculus of gallbladder with chronic cholecystitis without obstruction: Secondary | ICD-10-CM | POA: Diagnosis present

## 2017-02-15 DIAGNOSIS — O99611 Diseases of the digestive system complicating pregnancy, first trimester: Principal | ICD-10-CM | POA: Diagnosis present

## 2017-02-15 DIAGNOSIS — F329 Major depressive disorder, single episode, unspecified: Secondary | ICD-10-CM | POA: Diagnosis present

## 2017-02-15 LAB — COMPREHENSIVE METABOLIC PANEL
ALK PHOS: 75 U/L (ref 38–126)
ALT: 21 U/L (ref 14–54)
AST: 22 U/L (ref 15–41)
Albumin: 4.2 g/dL (ref 3.5–5.0)
Anion gap: 10 (ref 5–15)
BILIRUBIN TOTAL: 0.6 mg/dL (ref 0.3–1.2)
BUN: 15 mg/dL (ref 6–20)
CALCIUM: 9.4 mg/dL (ref 8.9–10.3)
CO2: 22 mmol/L (ref 22–32)
Chloride: 105 mmol/L (ref 101–111)
Creatinine, Ser: 0.9 mg/dL (ref 0.44–1.00)
GFR calc Af Amer: >60 mL/min (ref >60)
Glucose, Bld: 100 mg/dL — ABNORMAL HIGH (ref 65–99)
POTASSIUM: 4.2 mmol/L (ref 3.5–5.1)
Sodium: 137 mmol/L (ref 135–145)
TOTAL PROTEIN: 7.9 g/dL (ref 6.5–8.1)

## 2017-02-15 LAB — URINALYSIS, ROUTINE W REFLEX MICROSCOPIC
BILIRUBIN URINE: NEGATIVE
GLUCOSE, UA: NEGATIVE mg/dL
Ketones, ur: 20 mg/dL — AB
LEUKOCYTES UA: NEGATIVE
NITRITE: NEGATIVE
PH: 6 (ref 5.0–8.0)
Protein, ur: NEGATIVE mg/dL
SPECIFIC GRAVITY, URINE: 1.011 (ref 1.005–1.030)

## 2017-02-15 LAB — CBC
HEMATOCRIT: 42.6 % (ref 36.0–46.0)
Hemoglobin: 14.5 g/dL (ref 12.0–15.0)
MCH: 30.1 pg (ref 26.0–34.0)
MCHC: 34 g/dL (ref 30.0–36.0)
MCV: 88.6 fL (ref 78.0–100.0)
PLATELETS: 349 10*3/uL (ref 150–400)
RBC: 4.81 MIL/uL (ref 3.87–5.11)
RDW: 12.4 % (ref 11.5–15.5)
WBC: 11.1 10*3/uL — AB (ref 4.0–10.5)

## 2017-02-15 LAB — HCG, QUANTITATIVE, PREGNANCY: hCG, Beta Chain, Quant, S: 10 m[IU]/mL — ABNORMAL HIGH (ref <5)

## 2017-02-15 LAB — LIPASE, BLOOD: Lipase: 22 U/L (ref 11–51)

## 2017-02-15 MED ORDER — ONDANSETRON 4 MG PO TBDP
4.0000 mg | ORAL_TABLET | Freq: Four times a day (QID) | ORAL | Status: DC | PRN
  Administered 2017-02-17 – 2017-02-18 (×2): 4 mg via ORAL
  Filled 2017-02-15 (×2): qty 1

## 2017-02-15 MED ORDER — SODIUM CHLORIDE 0.9 % IV SOLN: INTRAVENOUS | Status: DC

## 2017-02-15 MED ORDER — ACETAMINOPHEN 650 MG RE SUPP: 650.0000 mg | Freq: Four times a day (QID) | RECTAL | Status: DC | PRN

## 2017-02-15 MED ORDER — SODIUM CHLORIDE 0.9 % IV BOLUS (SEPSIS)
1000.0000 mL | Freq: Once | INTRAVENOUS | Status: AC
  Administered 2017-02-15: 1000 mL via INTRAVENOUS

## 2017-02-15 MED ORDER — HYDRALAZINE HCL 20 MG/ML IJ SOLN: 10.0000 mg | INTRAMUSCULAR | Status: DC | PRN

## 2017-02-15 MED ORDER — ONDANSETRON HCL 4 MG/2ML IJ SOLN
4.0000 mg | Freq: Four times a day (QID) | INTRAMUSCULAR | Status: DC | PRN
Start: 2017-02-15 — End: 2017-02-20
  Administered 2017-02-15 – 2017-02-19 (×3): 4 mg via INTRAVENOUS
  Filled 2017-02-15 (×3): qty 2

## 2017-02-15 MED ORDER — KCL IN DEXTROSE-NACL 20-5-0.45 MEQ/L-%-% IV SOLN
INTRAVENOUS | Status: DC
  Administered 2017-02-15 – 2017-02-20 (×7): via INTRAVENOUS
  Filled 2017-02-15 (×9): qty 1000

## 2017-02-15 MED ORDER — ONDANSETRON HCL 4 MG/2ML IJ SOLN
4.0000 mg | Freq: Once | INTRAMUSCULAR | Status: AC
  Administered 2017-02-15: 4 mg via INTRAVENOUS
  Filled 2017-02-15: qty 2

## 2017-02-15 MED ORDER — ACETAMINOPHEN 325 MG PO TABS: 650.0000 mg | ORAL_TABLET | Freq: Four times a day (QID) | ORAL | Status: DC | PRN

## 2017-02-15 MED ORDER — DEXTROSE 5 % IV SOLN
2.0000 g | INTRAVENOUS | Status: DC
  Administered 2017-02-15 – 2017-02-19 (×5): 2 g via INTRAVENOUS
  Filled 2017-02-15 (×6): qty 2

## 2017-02-15 MED ORDER — HYDROMORPHONE HCL 1 MG/ML IJ SOLN
1.0000 mg | Freq: Once | INTRAMUSCULAR | Status: AC
  Administered 2017-02-15: 1 mg via INTRAVENOUS
  Filled 2017-02-15: qty 1

## 2017-02-15 MED ORDER — OXYCODONE HCL 5 MG PO TABS: 5.0000 mg | ORAL_TABLET | ORAL | Status: DC | PRN

## 2017-02-15 MED ORDER — HYDROMORPHONE HCL 1 MG/ML IJ SOLN
0.5000 mg | INTRAMUSCULAR | Status: DC | PRN
  Administered 2017-02-15 – 2017-02-18 (×3): 0.5 mg via INTRAVENOUS
  Filled 2017-02-15: qty 1
  Filled 2017-02-15 (×4): qty 0.5

## 2017-02-15 MED ORDER — PROMETHAZINE HCL 25 MG/ML IJ SOLN
12.5000 mg | Freq: Four times a day (QID) | INTRAMUSCULAR | Status: DC | PRN
  Administered 2017-02-15 – 2017-02-19 (×2): 12.5 mg via INTRAVENOUS
  Filled 2017-02-15 (×3): qty 1

## 2017-02-15 NOTE — ED Triage Notes (Signed)
Pt complaint of right abdominal pain with associated diarrhea for 3 weeks; told she has a "bad gallbladder."

## 2017-02-15 NOTE — ED Provider Notes (Signed)
[name] Provider Note   CSN: [id number] Arrival date & time: 02/15/17  0545     History   Chief Complaint Chief Complaint  Patient presents with  . Abdominal Pain    HPI Jill Aguilar is a 26 y.o. female.  26 year old female presents with several days of left upper quadrant as well as right-sided back pain.  Was seen at [facility] 3 days ago for similar symptoms and was diagnosed with possible cholecystitis.  Was evaluated by a general surgeon who felt that this was from her reflux.  Patient symptoms are characterized as burning and worse when she eats and better with drinking clear liquids.  Denies any fever, diarrhea.  She has not been short of breath.  Denies any anginal type symptoms.  Has not used any pain medication prior to arrival.      Past Medical History:  Diagnosis Date  . Anxiety   . Depression     Patient Active Problem List   Diagnosis Date Noted  . Calculus of gallbladder with chronic cholecystitis without obstruction   . 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 PTSD (post-traumatic stress disorder) 03/16/2015  . 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 CESAREAN SECTION    . WISDOM TOOTH EXTRACTION Bilateral 10/2002    OB History    Gravida Para Term Preterm AB Living   0 0 0 0 0     SAB TAB Ectopic Multiple Live Births   0 0 0           Home Medications    Prior to Admission medications   Medication Sig Start Date End Date Taking? Authorizing Provider  famotidine (PEPCID) 20 MG tablet Take 1 tablet (20 mg total) by mouth 2 (two) times daily. 02/11/17 02/11/18  Merrily Brittleifenbark, Neil, MD  ibuprofen (ADVIL,MOTRIN) 800 MG tablet Take 1 tablet (800 mg total) by mouth 3 (three) times daily. Patient not taking: Reported on 02/11/2017 08/13/15   Elpidio AnisUpstill, Shari, PA-C  methocarbamol (ROBAXIN) 500 MG tablet Take 1  tablet (500 mg total) by mouth 2 (two) times daily. Patient not taking: Reported on 02/11/2017 08/13/15   Elpidio AnisUpstill, Shari, PA-C  methylphenidate (CONCERTA) 18 MG PO CR tablet Take 1 tablet (18 mg total) by mouth 2 (two) times daily with breakfast and lunch. Patient not taking: Reported on 02/11/2017 06/09/15 06/08/16  Gayland Curryadepalli, Gayathri D, MD  [facility] PO Take 1 capsule by mouth daily.    [provider]    Family History No family history on file.  Social History Social History  Substance Use Topics  . Smoking status: Never Smoker  . Smokeless tobacco: Never Used  . Alcohol use 3.6 oz/week    2 Glasses of wine, 2 Cans of beer, 2 Shots of liquor per week     Allergies   Patient has no known allergies.   Review of Systems Review of Systems  All other systems reviewed and are negative.    Physical Exam Updated Vital Signs BP (!) 138/92 (BP Location: Left Arm)   Pulse 94   Temp 98.3 F (36.8 C) (Oral)   Resp 17   LMP 01/15/2017   SpO2 100%   Physical Exam  Constitutional: She is oriented to person, place, and time. She appears well-developed and well-nourished.  Non-toxic appearance. No distress.  HENT:  Head: Normocephalic and atraumatic.  Eyes: Pupils are equal, round, and reactive to light.  Conjunctivae, EOM and lids are normal.  Neck: Normal range of motion. Neck supple. No tracheal deviation present. No thyroid mass present.  Cardiovascular: Normal rate, regular rhythm and normal heart sounds.  Exam reveals no gallop.   No murmur heard. Pulmonary/Chest: Effort normal and breath sounds normal. No stridor. No respiratory distress. She has no decreased breath sounds. She has no wheezes. She has no rhonchi. She has no rales.  Abdominal: Soft. Normal appearance and bowel sounds are normal. She exhibits no distension. There is tenderness in the right upper quadrant, epigastric area and left upper quadrant. There is no rebound and no CVA tenderness.     Musculoskeletal: Normal range of motion. She exhibits no edema or tenderness.  Neurological: She is alert and oriented to person, place, and time. She has normal strength. No cranial nerve deficit or sensory deficit. GCS eye subscore is 4. GCS verbal subscore is 5. GCS motor subscore is 6.  Skin: Skin is warm and dry. No abrasion and no rash noted.  Psychiatric: She has a normal mood and affect. Her speech is normal and behavior is normal.  Nursing note and vitals reviewed.    ED Treatments / Results  Labs (all labs ordered are listed, but only abnormal results are displayed) Labs Reviewed  LIPASE, BLOOD  COMPREHENSIVE METABOLIC PANEL  CBC  URINALYSIS, ROUTINE W REFLEX MICROSCOPIC  HCG, QUANTITATIVE, PREGNANCY    EKG  EKG Interpretation None       Radiology No results found.  Procedures Procedures (including critical care time)  Medications Ordered in ED Medications  sodium chloride 0.9 % bolus 1,000 mL (not administered)  0.9 %  sodium chloride infusion (not administered)  HYDROmorphone (DILAUDID) injection 1 mg (not administered)  ondansetron (ZOFRAN) injection 4 mg (not administered)     Initial Impression / Assessment and Plan / ED Course  I have reviewed the triage vital signs and the nursing notes.  Pertinent labs & imaging results that were available during my care of the patient were reviewed by me and considered in my medical decision making (see chart for details).     Patient medicated for pain and feels better.  Discussed case with general surgery who will come and see the patient  Final Clinical Impressions(s) / ED Diagnoses   Final diagnoses:  None    New Prescriptions New Prescriptions   No medications on file     Lorre Nick, MD 02/15/17 1113

## 2017-02-15 NOTE — ED Notes (Signed)
Admitting nurse at bedside 

## 2017-02-15 NOTE — ED Notes (Signed)
Pt ambulatory to bathroom with assistance.

## 2017-02-15 NOTE — H&P (Signed)
[facility] Admission Note  Jill Aguilar [birth year]  [id number].    Requesting MD: Zenia Resides Chief Complaint/Reason for Consult: RUQ pain  HPI:  Patient is a 26 y.o. Female who presented to [facility] this AM with RUQ pain. Was seen at [facility] 02/11/17 and had RUQ [place] and CT that showed cholelithiasis and gallbladder wall thickening, WBC was 11.8 at that time. Patient was discharged from ED 10/28. Patient continues to have abdominal pain in the RUQ, LUQ and pain that radiates to chest and right shoulder. Patient also reports chills, nausea, polyuria, pain with deep inspiration, diarrhea. Denies fever, palpitations, SOB, blood in stool, dysuria. Patient PMH significant for pre-diabetes, does not take any daily medications. Past surgical history significant for cesarean section 8 years ago. 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 Patient denies tobacco use past or present. Reports 5-6 alcoholic drinks per week, occasional marijuana use.   ROS: Review of Systems  Constitutional: Positive for chills. Negative for fever.  Respiratory: Negative for shortness of breath and wheezing.   Cardiovascular: Positive for chest pain. Negative for palpitations.  Gastrointestinal: Positive for abdominal pain, diarrhea and nausea. Negative for blood in stool, constipation, heartburn and vomiting.  Genitourinary: Positive for frequency. Negative for dysuria, hematuria and urgency.  Musculoskeletal: Positive for back pain (right shoulder blade).  All other systems reviewed and are negative.   No family history on file.  Past Medical History:  Diagnosis Date  . Anxiety   . Depression     Past Surgical History:  Procedure Laterality Date  . CESAREAN SECTION    . WISDOM TOOTH EXTRACTION Bilateral 10/2002    Social History:  reports that she has never smoked. She has never used smokeless tobacco. She reports that she drinks about 3.6 oz of alcohol per week . She reports  that she uses drugs, including Marijuana, about 1 time per week.  Allergies: No Known Allergies   (Not in a hospital admission)  Blood pressure (!) 127/99, pulse 80, temperature 98.1 F (36.7 C), temperature source Oral, resp. rate 18, last menstrual period 01/15/2017, SpO2 98 %. Physical Exam: Physical Exam  Constitutional: She is oriented to person, place, and time. She appears well-developed and well-nourished. She is cooperative.  Non-toxic appearance. No distress.  HENT:  Head: Normocephalic and atraumatic.  Right Ear: External ear normal.  Left Ear: External ear normal.  Nose: Nose normal.  Mouth/Throat: Oropharynx is clear and moist and mucous membranes are normal.  Eyes: Pupils are equal, round, and reactive to light. Conjunctivae, EOM and lids are normal. No scleral icterus.  Neck: Normal range of motion and phonation normal. Neck supple. No thyromegaly present.  Cardiovascular: Normal rate and regular rhythm.   Pulses:      Radial pulses are 2+ on the right side, and 2+ on the left side.       Dorsalis pedis pulses are 2+ on the right side, and 2+ on the left side.  No LE edema BL  Pulmonary/Chest: Effort normal and breath sounds normal.  Abdominal: Soft. Bowel sounds are normal. She exhibits no distension and no mass. There is no hepatosplenomegaly. There is tenderness in the right upper quadrant, epigastric area and left upper quadrant. There is no rigidity, no rebound, no guarding, no tenderness at McBurney's point and negative Murphy's sign. No hernia.  Musculoskeletal:  ROM grossly intact in BL upper and lower extremities  Neurological: She is alert and oriented to person, place, and time. She  has normal strength. No sensory deficit.  Skin: Skin is warm, dry and intact. No rash noted. She is not diaphoretic. No pallor.  Psychiatric: She has a normal mood and affect. Her speech is normal and behavior is normal.    Results for orders placed or performed during the  hospital encounter of 02/15/17 (from the past 48 hour(s))  Lipase, blood     Status: None   Collection Time: 02/15/17  9:35 AM  Result Value Ref Range   Lipase 22 11 - 51 U/L  Comprehensive metabolic panel     Status: Abnormal   Collection Time: 02/15/17  9:35 AM  Result Value Ref Range   Sodium 137 135 - 145 mmol/L   Potassium 4.2 3.5 - 5.1 mmol/L   Chloride 105 101 - 111 mmol/L   CO2 22 22 - 32 mmol/L   Glucose, Bld 100 (H) 65 - 99 mg/dL   BUN 15 6 - 20 mg/dL   Creatinine, Ser 0.90 0.44 - 1.00 mg/dL   Calcium 9.4 8.9 - 10.3 mg/dL   Total Protein 7.9 6.5 - 8.1 g/dL   Albumin 4.2 3.5 - 5.0 g/dL   AST 22 15 - 41 U/L   ALT 21 14 - 54 U/L   Alkaline Phosphatase 75 38 - 126 U/L   Total Bilirubin 0.6 0.3 - 1.2 mg/dL   GFR calc non Af Amer >60 >60 mL/min   GFR calc Af Amer >60 >60 mL/min    Comment: (NOTE) The eGFR has been calculated using the CKD EPI equation. This calculation has not been validated in all clinical situations. eGFR's persistently <60 mL/min signify possible Chronic Kidney Disease.    Anion gap 10 5 - 15  CBC     Status: Abnormal   Collection Time: 02/15/17  9:35 AM  Result Value Ref Range   WBC 11.1 (H) 4.0 - 10.5 K/uL   RBC 4.81 3.87 - 5.11 MIL/uL   Hemoglobin 14.5 12.0 - 15.0 g/dL   HCT 42.6 36.0 - 46.0 %   MCV 88.6 78.0 - 100.0 fL   MCH 30.1 26.0 - 34.0 pg   MCHC 34.0 30.0 - 36.0 g/dL   RDW 12.4 11.5 - 15.5 %   Platelets 349 150 - 400 K/uL  hCG, quantitative, pregnancy     Status: Abnormal   Collection Time: 02/15/17  9:35 AM  Result Value Ref Range   hCG, Beta Chain, Quant, S 10 (H) <5 mIU/mL    Comment:          GEST. AGE      CONC.  (mIU/mL)   <=1 WEEK        5 - 50     2 WEEKS       50 - 500     3 WEEKS       100 - 10,000     4 WEEKS     1,000 - 30,000     5 WEEKS     3,500 - 115,000   6-8 WEEKS     12,000 - 270,000    12 WEEKS     15,000 - 220,000        FEMALE AND NON-PREGNANT FEMALE:     LESS THAN 5 mIU/mL    No results  found.    Assessment/Plan Cholelithiasis with probable Cholecystits - LFTs all WNL, Tbili 0.6 - WBC 11.1, afebrile - patient TTP in the RUQ and imaging from 10/28 shows gallstones with gallbladder wall thickening - admit  to observation, start on IV abx Positive Beta hCG - quantitative 10, LMP 1 month ago, last intercourse 1 week ago - spoke with OB, they stated that it is okay to proceed with surgery as usual - minimize radiation exposure, may need OB follow up outpatient  FEN: NPO, IVF VTE: SCDs ID: [email or web address]  Plan: admit to observation. IV abx, keep NPO for now. Will discuss final surgical plan with MD.  Brigid Re, [facility] 02/15/2017, 11:01 AM Pager: [phone] Consults: [phone] Mon-Fri 7:00 am-4:30 pm Sat-Sun 7:00 am-11:30 am

## 2017-02-15 NOTE — ED Notes (Signed)
ED TO INPATIENT HANDOFF REPORT  Name/Age/Gender Jill Aguilar 26 y.o. female  Code Status    Code Status Orders        Start     Ordered   02/15/17 1141  Full code  Continuous     02/15/17 1156    Code Status History    Date Active Date Inactive Code Status Order ID Comments User Context   This patient has a current code status but no historical code status.      Home/SNF/Other Home  Chief Complaint Abd Pain  Level of Care/Admitting Diagnosis ED Disposition    ED Disposition Condition Comment   Admit  Hospital Area: [place] [[id number]]  Level of Care: Med-Surg [16]  Diagnosis: Symptomatic cholelithiasis [027741]  Admitting Physician: Jackolyn Confer [[id number]]  Attending Physician: CCS, MD [[id number]]  Bed request comments: 5W  PT Class (Do Not Modify): Observation [104]  PT Acc Code (Do Not Modify): Observation [10022]       Medical History Past Medical History:  Diagnosis Date  . Anxiety   . Depression     Allergies No Known Allergies  IV Location/Drains/Wounds Patient Lines/Drains/Airways Status   Active Line/Drains/Airways    Name:   Placement date:   Placement time:   Site:   Days:   Peripheral IV 02/15/17 Right Antecubital  02/15/17    0943    Antecubital    less than 1          Labs/Imaging Results for orders placed or performed during the hospital encounter of 02/15/17 (from the past 48 hour(s))  Lipase, blood     Status: None   Collection Time: 02/15/17  9:35 AM  Result Value Ref Range   Lipase 22 11 - 51 U/L  Comprehensive metabolic panel     Status: Abnormal   Collection Time: 02/15/17  9:35 AM  Result Value Ref Range   Sodium 137 135 - 145 mmol/L   Potassium 4.2 3.5 - 5.1 mmol/L   Chloride 105 101 - 111 mmol/L   CO2 22 22 - 32 mmol/L   Glucose, Bld 100 (H) 65 - 99 mg/dL   BUN 15 6 - 20 mg/dL   Creatinine, Ser 0.90 0.44 - 1.00 mg/dL   Calcium 9.4 8.9 - 10.3 mg/dL   Total Protein 7.9 6.5 - 8.1 g/dL   Albumin 4.2 3.5 - 5.0 g/dL   AST 22 15 - 41 U/L   ALT 21 14 - 54 U/L   Alkaline Phosphatase 75 38 - 126 U/L   Total Bilirubin 0.6 0.3 - 1.2 mg/dL   GFR calc non Af Amer >60 >60 mL/min   GFR calc Af Amer >60 >60 mL/min    Comment: (NOTE) The eGFR has been calculated using the CKD EPI equation. This calculation has not been validated in all clinical situations. eGFR's persistently <60 mL/min signify possible Chronic Kidney Disease.    Anion gap 10 5 - 15  CBC     Status: Abnormal   Collection Time: 02/15/17  9:35 AM  Result Value Ref Range   WBC 11.1 (H) 4.0 - 10.5 K/uL   RBC 4.81 3.87 - 5.11 MIL/uL   Hemoglobin 14.5 12.0 - 15.0 g/dL   HCT 42.6 36.0 - 46.0 %   MCV 88.6 78.0 - 100.0 fL   MCH 30.1 26.0 - 34.0 pg   MCHC 34.0 30.0 - 36.0 g/dL   RDW 12.4 11.5 - 15.5 %   Platelets 349 150 - 400 K/uL  hCG, quantitative, pregnancy     Status: Abnormal   Collection Time: 02/15/17  9:35 AM  Result Value Ref Range   hCG, Beta Chain, Quant, S 10 (H) <5 mIU/mL    Comment:          GEST. AGE      CONC.  (mIU/mL)   <=1 WEEK        5 - 50     2 WEEKS       50 - 500     3 WEEKS       100 - 10,000     4 WEEKS     1,000 - 30,000     5 WEEKS     3,500 - 115,000   6-8 WEEKS     12,000 - 270,000    12 WEEKS     15,000 - 220,000        FEMALE AND NON-PREGNANT FEMALE:     LESS THAN 5 mIU/mL   Urinalysis, Routine w reflex microscopic     Status: Abnormal   Collection Time: 02/15/17  2:20 PM  Result Value Ref Range   Color, Urine STRAW (A) YELLOW   APPearance CLEAR CLEAR   Specific Gravity, Urine 1.011 1.005 - 1.030   pH 6.0 5.0 - 8.0   Glucose, UA NEGATIVE NEGATIVE mg/dL   Hgb urine dipstick MODERATE (A) NEGATIVE   Bilirubin Urine NEGATIVE NEGATIVE   Ketones, ur 20 (A) NEGATIVE mg/dL   Protein, ur NEGATIVE NEGATIVE mg/dL   Nitrite NEGATIVE NEGATIVE   Leukocytes, UA NEGATIVE NEGATIVE   RBC / HPF 0-5 0 - 5 RBC/hpf   WBC, UA 0-5 0 - 5 WBC/hpf   Bacteria, UA RARE (A) NONE SEEN   Squamous  Epithelial / LPF 0-5 (A) NONE SEEN   Mucus PRESENT    No results found.  Pending Labs [facility]     Ordered   02/17/17 0500  hCG, quantitative, pregnancy  Once,   R     02/15/17 1503   02/17/17 0500  CBC  Once,   R     02/15/17 1503   02/16/17 0500  Comprehensive metabolic panel  Tomorrow morning,   R     02/15/17 1156   02/16/17 0500  CBC  Tomorrow morning,   R     02/15/17 1156   02/15/17 1141  HIV antibody (Routine Testing)  Once,   R     02/15/17 1156      Vitals/Pain Today's Vitals   02/15/17 1000 02/15/17 1030 02/15/17 1113 02/15/17 1459  BP: 114/75 114/80  111/79  Pulse: 83 68  72  Resp:    16  Temp:      TempSrc:      SpO2: 100% 100%  97%  PainSc:   2      Isolation Precautions No active isolations  Medications Medications  0.9 %  sodium chloride infusion ( Intravenous New Bag/Given 02/15/17 0935)  dextrose 5 % and 0.45 % NaCl with KCl 20 mEq/L infusion (not administered)  cefTRIAXone (ROCEPHIN) 2 g in dextrose 5 % 50 mL IVPB (2 g Intravenous New Bag/Given 02/15/17 1418)  acetaminophen (TYLENOL) tablet 650 mg (not administered)    Or  acetaminophen (TYLENOL) suppository 650 mg (not administered)  oxyCODONE (Oxy IR/ROXICODONE) immediate release tablet 5-10 mg (not administered)  HYDROmorphone (DILAUDID) injection 0.5 mg (0.5 mg Intravenous Given 02/15/17 1416)  ondansetron (ZOFRAN-ODT) disintegrating tablet 4 mg ( Oral See Alternative 02/15/17 1415)  Or  ondansetron [facility]) injection 4 mg (4 mg Intravenous Given 02/15/17 1415)  hydrALAZINE (APRESOLINE) injection 10 mg (not administered)  sodium chloride 0.9 % bolus 1,000 mL (1,000 mLs Intravenous New Bag/Given 02/15/17 0935)  HYDROmorphone (DILAUDID) injection 1 mg (1 mg Intravenous Given 02/15/17 0937)  ondansetron (ZOFRAN) injection 4 mg (4 mg Intravenous Given 02/15/17 0936)    Mobility walks

## 2017-02-15 NOTE — Progress Notes (Signed)
Patient requesting to speak with MD directly in regards to questions she has about possible surgery. Paged Kelly,PA.

## 2017-02-15 NOTE — Progress Notes (Signed)
Patient still nauseated after Zofran IV, spoke with Kelly,PA and ordered phenergan IV.

## 2017-02-16 DIAGNOSIS — O99321 Drug use complicating pregnancy, first trimester: Secondary | ICD-10-CM | POA: Diagnosis present

## 2017-02-16 DIAGNOSIS — O9989 Other specified diseases and conditions complicating pregnancy, childbirth and the puerperium: Secondary | ICD-10-CM | POA: Diagnosis present

## 2017-02-16 DIAGNOSIS — O99611 Diseases of the digestive system complicating pregnancy, first trimester: Secondary | ICD-10-CM | POA: Diagnosis present

## 2017-02-16 DIAGNOSIS — F329 Major depressive disorder, single episode, unspecified: Secondary | ICD-10-CM | POA: Diagnosis present

## 2017-02-16 DIAGNOSIS — F419 Anxiety disorder, unspecified: Secondary | ICD-10-CM | POA: Diagnosis present

## 2017-02-16 DIAGNOSIS — F1290 Cannabis use, unspecified, uncomplicated: Secondary | ICD-10-CM | POA: Diagnosis present

## 2017-02-16 DIAGNOSIS — R7303 Prediabetes: Secondary | ICD-10-CM | POA: Diagnosis present

## 2017-02-16 DIAGNOSIS — O99341 Other mental disorders complicating pregnancy, first trimester: Secondary | ICD-10-CM | POA: Diagnosis present

## 2017-02-16 DIAGNOSIS — K8010 Calculus of gallbladder with chronic cholecystitis without obstruction: Secondary | ICD-10-CM | POA: Diagnosis present

## 2017-02-16 DIAGNOSIS — Z3A01 Less than 8 weeks gestation of pregnancy: Secondary | ICD-10-CM | POA: Diagnosis not present

## 2017-02-16 LAB — CBC
HEMATOCRIT: 39.9 % (ref 36.0–46.0)
HEMOGLOBIN: 13.3 g/dL (ref 12.0–15.0)
MCH: 29.8 pg (ref 26.0–34.0)
MCHC: 33.3 g/dL (ref 30.0–36.0)
MCV: 89.3 fL (ref 78.0–100.0)
Platelets: 305 10*3/uL (ref 150–400)
RBC: 4.47 MIL/uL (ref 3.87–5.11)
RDW: 12.5 % (ref 11.5–15.5)
WBC: 8.2 10*3/uL (ref 4.0–10.5)

## 2017-02-16 LAB — COMPREHENSIVE METABOLIC PANEL
ALBUMIN: 3.4 g/dL — AB (ref 3.5–5.0)
ALK PHOS: 65 U/L (ref 38–126)
ALT: 18 U/L (ref 14–54)
ANION GAP: 7 (ref 5–15)
AST: 17 U/L (ref 15–41)
BILIRUBIN TOTAL: 0.7 mg/dL (ref 0.3–1.2)
BUN: 8 mg/dL (ref 6–20)
CALCIUM: 8.6 mg/dL — AB (ref 8.9–10.3)
CO2: 22 mmol/L (ref 22–32)
Chloride: 108 mmol/L (ref 101–111)
Creatinine, Ser: 0.83 mg/dL (ref 0.44–1.00)
GFR calc non Af Amer: >60 mL/min (ref >60)
GLUCOSE: 101 mg/dL — AB (ref 65–99)
POTASSIUM: 3.8 mmol/L (ref 3.5–5.1)
SODIUM: 137 mmol/L (ref 135–145)
TOTAL PROTEIN: 6.7 g/dL (ref 6.5–8.1)

## 2017-02-16 LAB — HIV ANTIBODY (ROUTINE TESTING W REFLEX): HIV Screen 4th Generation wRfx: NONREACTIVE

## 2017-02-16 NOTE — Progress Notes (Signed)
Assessment Active Problems:   Symptomatic cholelithiasis, cholecystitis-she feel much better   Plan:  Clear liquids. Continue abxs   LOS: 0 days        Chief Complaint/Subjective: Sitting up in bed on her laptop computer smiling.  Feels much better.  Nausea improved.  Objective: Vital signs in last 24 hours: Temp:  [98 F (36.7 C)-98.4 F (36.9 C)] 98 F (36.7 C) (11/02 0600) Pulse Rate:  [59-83] 59 (11/02 0600) Resp:  [13-18] 15 (11/02 0600) BP: (104-129)/(56-99) 109/62 (11/02 0600) SpO2:  [97 %-100 %] 100 % (11/02 0600) Weight:  [86.2 kg (190 lb)] 86.2 kg (190 lb) (11/01 2224)    Intake/Output from previous day: 11/01 0701 - 11/02 0700 In: 2477.1 [I.V.:2427.1; IV Piggyback:50] Out: -  Intake/Output this shift: No intake/output data recorded.  PE: General- In NAD.  Awake and alert. Abdomen-soft less tender in upper abdomen  Lab Results:   Recent Labs  02/15/17 0935 02/16/17 0533  WBC 11.1* 8.2  HGB 14.5 13.3  HCT 42.6 39.9  PLT 349 305   BMET  Recent Labs  02/15/17 0935 02/16/17 0533  NA 137 137  K 4.2 3.8  CL 105 108  CO2 22 22  GLUCOSE 100* 101*  BUN 15 8  CREATININE 0.90 0.83  CALCIUM 9.4 8.6*   PT/INR No results for input(s): LABPROT, INR in the last 72 hours. Comprehensive Metabolic Panel:    Component Value Date/Time   NA 137 02/16/2017 0533   NA 137 02/15/2017 0935   K 3.8 02/16/2017 0533   K 4.2 02/15/2017 0935   CL 108 02/16/2017 0533   CL 105 02/15/2017 0935   CO2 22 02/16/2017 0533   CO2 22 02/15/2017 0935   BUN 8 02/16/2017 0533   BUN 15 02/15/2017 0935   CREATININE 0.83 02/16/2017 0533   CREATININE 0.90 02/15/2017 0935   GLUCOSE 101 (H) 02/16/2017 0533   GLUCOSE 100 (H) 02/15/2017 0935   CALCIUM 8.6 (L) 02/16/2017 0533   CALCIUM 9.4 02/15/2017 0935   AST 17 02/16/2017 0533   AST 22 02/15/2017 0935   ALT 18 02/16/2017 0533   ALT 21 02/15/2017 0935   ALKPHOS 65 02/16/2017 0533   ALKPHOS 75 02/15/2017 0935   BILITOT  0.7 02/16/2017 0533   BILITOT 0.6 02/15/2017 0935   PROT 6.7 02/16/2017 0533   PROT 7.9 02/15/2017 0935   ALBUMIN 3.4 (L) 02/16/2017 0533   ALBUMIN 4.2 02/15/2017 0935     Studies/Results: No results found.  Anti-infectives: Anti-infectives    Start     Dose/Rate Route Frequency Ordered Stop   02/15/17 1400  cefTRIAXone (ROCEPHIN) 2 g in dextrose 5 % 50 mL IVPB     2 g 100 mL/hr over 30 Minutes Intravenous Every 24 hours 02/15/17 1156         Jill Aguilar J 02/16/2017

## 2017-02-17 LAB — CBC
HEMATOCRIT: 42.5 % (ref 36.0–46.0)
HEMOGLOBIN: 14 g/dL (ref 12.0–15.0)
MCH: 29.4 pg (ref 26.0–34.0)
MCHC: 32.9 g/dL (ref 30.0–36.0)
MCV: 89.1 fL (ref 78.0–100.0)
PLATELETS: 341 10*3/uL (ref 150–400)
RBC: 4.77 MIL/uL (ref 3.87–5.11)
RDW: 12.4 % (ref 11.5–15.5)
WBC: 10 10*3/uL (ref 4.0–10.5)

## 2017-02-17 LAB — HCG, QUANTITATIVE, PREGNANCY: hCG, Beta Chain, Quant, S: 37 m[IU]/mL — ABNORMAL HIGH (ref <5)

## 2017-02-17 MED ORDER — FAMOTIDINE 20 MG PO TABS
20.0000 mg | ORAL_TABLET | Freq: Two times a day (BID) | ORAL | Status: DC
  Administered 2017-02-17 – 2017-02-20 (×6): 20 mg via ORAL
  Filled 2017-02-17 (×6): qty 1

## 2017-02-17 NOTE — Progress Notes (Signed)
Pt reported blood in stool; described as bright red steaks of blood; Patient flushed toilet prior to reporting. Patient also reported blood in stool to CCS on admission. Patient states same amount of blood noted as when reported on 11/1. Pt advised to report further incidents to RN prior to flushing. Pt c/o increased abdominal pain; states may have ate to heavily. Will decrease to full liquid diet and continue to monitor.

## 2017-02-17 NOTE — Progress Notes (Signed)
Assessment Active Problems:   Symptomatic cholelithiasis, cholecystitis-sxs resolved    Early first trimester pregnancy-beta hcg up  Plan:   Continue abxs. Advance diet.  Attempt to get her through pregnancy or at least into second trimester before doing cholecystectomy.   LOS: 1 day        Chief Complaint/Subjective: No abdominal pain.  Occasional nausea.  Tolerating clear liquids. Objective: Vital signs in last 24 hours: Temp:  [98.4 F (36.9 C)] 98.4 F (36.9 C) (11/03 [id number]) Pulse Rate:  [60-71] 60 (11/03 0608) Resp:  [16] 16 (11/03 0608) BP: (100-112)/(63-65) 100/63 (11/03 0608) SpO2:  [100 %] 100 % (11/03 0608) Last BM Date: 02/16/17  Intake/Output from previous day: 11/02 0701 - 11/03 0700 In: 2293.8 [P.O.:840; I.V.:1453.8] Out: 7 [Urine:6; Stool:1] Intake/Output this shift: Total I/O In: 360 [P.O.:360] Out: -   PE: General- In NAD.  Awake and alert. Abdomen-soft not tender in upper abdomen  Lab Results:   Recent Labs  02/16/17 0533 02/17/17 0629  WBC 8.2 10.0  HGB 13.3 14.0  HCT 39.9 42.5  PLT 305 341   BMET  Recent Labs  02/15/17 0935 02/16/17 0533  NA 137 137  K 4.2 3.8  CL 105 108  CO2 22 22  GLUCOSE 100* 101*  BUN 15 8  CREATININE 0.90 0.83  CALCIUM 9.4 8.6*   PT/INR No results for input(s): LABPROT, INR in the last 72 hours. Comprehensive Metabolic Panel:    Component Value Date/Time   NA 137 02/16/2017 0533   NA 137 02/15/2017 0935   K 3.8 02/16/2017 0533   K 4.2 02/15/2017 0935   CL 108 02/16/2017 0533   CL 105 02/15/2017 0935   CO2 22 02/16/2017 0533   CO2 22 02/15/2017 0935   BUN 8 02/16/2017 0533   BUN 15 02/15/2017 0935   CREATININE 0.83 02/16/2017 0533   CREATININE 0.90 02/15/2017 0935   GLUCOSE 101 (H) 02/16/2017 0533   GLUCOSE 100 (H) 02/15/2017 0935   CALCIUM 8.6 (L) 02/16/2017 0533   CALCIUM 9.4 02/15/2017 0935   AST 17 02/16/2017 0533   AST 22 02/15/2017 0935   ALT 18 02/16/2017 0533   ALT 21 02/15/2017  0935   ALKPHOS 65 02/16/2017 0533   ALKPHOS 75 02/15/2017 0935   BILITOT 0.7 02/16/2017 0533   BILITOT 0.6 02/15/2017 0935   PROT 6.7 02/16/2017 0533   PROT 7.9 02/15/2017 0935   ALBUMIN 3.4 (L) 02/16/2017 0533   ALBUMIN 4.2 02/15/2017 0935     Studies/Results: No results found.  Anti-infectives: Anti-infectives    Start     Dose/Rate Route Frequency Ordered Stop   02/15/17 1400  cefTRIAXone (ROCEPHIN) 2 g in dextrose 5 % 50 mL IVPB     2 g 100 mL/hr over 30 Minutes Intravenous Every 24 hours 02/15/17 1156         Jill Aguilar 02/17/2017

## 2017-02-18 ENCOUNTER — Other Ambulatory Visit: Payer: Self-pay

## 2017-02-18 LAB — COMPREHENSIVE METABOLIC PANEL
ALBUMIN: 3.9 g/dL (ref 3.5–5.0)
ALK PHOS: 79 U/L (ref 38–126)
ALT: 20 U/L (ref 14–54)
AST: 22 U/L (ref 15–41)
Anion gap: 9 (ref 5–15)
BUN: 8 mg/dL (ref 6–20)
CALCIUM: 9.5 mg/dL (ref 8.9–10.3)
CHLORIDE: 104 mmol/L (ref 101–111)
CO2: 23 mmol/L (ref 22–32)
CREATININE: 0.86 mg/dL (ref 0.44–1.00)
GFR calc Af Amer: >60 mL/min (ref >60)
GFR calc non Af Amer: >60 mL/min (ref >60)
GLUCOSE: 108 mg/dL — AB (ref 65–99)
Potassium: 4 mmol/L (ref 3.5–5.1)
SODIUM: 136 mmol/L (ref 135–145)
Total Bilirubin: 0.5 mg/dL (ref 0.3–1.2)
Total Protein: 7.9 g/dL (ref 6.5–8.1)

## 2017-02-18 LAB — CBC
HCT: 44.7 % (ref 36.0–46.0)
HEMOGLOBIN: 15.5 g/dL — AB (ref 12.0–15.0)
MCH: 30.2 pg (ref 26.0–34.0)
MCHC: 34.7 g/dL (ref 30.0–36.0)
MCV: 87 fL (ref 78.0–100.0)
PLATELETS: 323 10*3/uL (ref 150–400)
RBC: 5.14 MIL/uL — AB (ref 3.87–5.11)
RDW: 12.3 % (ref 11.5–15.5)
WBC: 9.2 10*3/uL (ref 4.0–10.5)

## 2017-02-18 LAB — LIPASE, BLOOD: Lipase: 24 U/L (ref 11–51)

## 2017-02-18 NOTE — Progress Notes (Signed)
Assessment Active Problems:   Symptomatic cholelithiasis, cholecystitis-had recurrent symptoms after eating some fatty food (heart healthy diet prescribed but some of the food was high in fat).    Early first trimester pregnancy-beta hcg up  Plan:   Continue abxs. Repeat CBC and CMET today.  Try strict lowfat diet today, discussed this with her.  If she has recurrent symptoms, will need cholecystectomy this admission.   LOS: 2 days        Chief Complaint/Subjective: Had typical biliary colic pain after eating some fatty food for dinner and grits for breakfast.   Objective: Vital signs in last 24 hours: Temp:  [98.1 F (36.7 C)-98.4 F (36.9 C)] 98.4 F (36.9 C) (11/04 0625) Pulse Rate:  [68-87] 86 (11/04 0625) Resp:  [15-18] 15 (11/04 0625) BP: (108-116)/(63-90) 108/90 (11/04 0625) SpO2:  [100 %] 100 % (11/04 0625) Last BM Date: 02/16/17  Intake/Output from previous day: 11/03 0701 - 11/04 0700 In: 1818.3 [P.O.:1320; I.V.:498.3] Out: -  Intake/Output this shift: No intake/output data recorded.  PE: General- In NAD.  Awake and alert. Abdomen-soft,  no tenderness in upper abdomen  Lab Results:  Recent Labs    02/16/17 0533 02/17/17 0629  WBC 8.2 10.0  HGB 13.3 14.0  HCT 39.9 42.5  PLT 305 341   BMET Recent Labs    02/16/17 0533  NA 137  K 3.8  CL 108  CO2 22  GLUCOSE 101*  BUN 8  CREATININE 0.83  CALCIUM 8.6*   PT/INR No results for input(s): LABPROT, INR in the last 72 hours. Comprehensive Metabolic Panel:    Component Value Date/Time   NA 137 02/16/2017 0533   NA 137 02/15/2017 0935   K 3.8 02/16/2017 0533   K 4.2 02/15/2017 0935   CL 108 02/16/2017 0533   CL 105 02/15/2017 0935   CO2 22 02/16/2017 0533   CO2 22 02/15/2017 0935   BUN 8 02/16/2017 0533   BUN 15 02/15/2017 0935   CREATININE 0.83 02/16/2017 0533   CREATININE 0.90 02/15/2017 0935   GLUCOSE 101 (H) 02/16/2017 0533   GLUCOSE 100 (H) 02/15/2017 0935   CALCIUM 8.6 (L) 02/16/2017  0533   CALCIUM 9.4 02/15/2017 0935   AST 17 02/16/2017 0533   AST 22 02/15/2017 0935   ALT 18 02/16/2017 0533   ALT 21 02/15/2017 0935   ALKPHOS 65 02/16/2017 0533   ALKPHOS 75 02/15/2017 0935   BILITOT 0.7 02/16/2017 0533   BILITOT 0.6 02/15/2017 0935   PROT 6.7 02/16/2017 0533   PROT 7.9 02/15/2017 0935   ALBUMIN 3.4 (L) 02/16/2017 0533   ALBUMIN 4.2 02/15/2017 0935     Studies/Results: No results found.  Anti-infectives: Anti-infectives (From admission, onward)   Start     Dose/Rate Route Frequency Ordered Stop   02/15/17 1400  cefTRIAXone (ROCEPHIN) 2 g in dextrose 5 % 50 mL IVPB     2 g 100 mL/hr over 30 Minutes Intravenous Every 24 hours 02/15/17 1156         Rai Sinagra J 02/18/2017

## 2017-02-19 NOTE — Progress Notes (Signed)
[facility] Progress Note      Assessment/Plan Cholelithiasis with probable Cholecystits - LFTs all WNL, Tbili 0.5 - WBC 9.2, afebrile - imaging from 10/28 shows gallstones with gallbladder wall thickening Positive Beta hCG - quantitative 37 on 11/03, LMP 1 month ago, last intercourse 1 week ago - spoke with OB, they stated that it is okay to proceed with surgery as usual - minimize radiation exposure, may need OB follow up outpatient  FEN: heart healthy VTE: SCDs ID: [email or web address]  Plan: If pt can tolerated diet today without pain then she will be discharged home tomorrow with f/u for surgery in her second trimester.    LOS: 3 days    Subjective:  CC: abdominal pain  Pt states she ate [place]key, green beans and broccoli and had severe abdominal pain last night. She states she needs to leave cause she has to send in her paperwork to schedule her boards. No nausea or vomiting.   Objective: Vital signs in last 24 hours: Temp:  [98.1 F (36.7 C)-98.2 F (36.8 C)] 98.1 F (36.7 C) (11/05 0629) Pulse Rate:  [65-90] 65 (11/05 0629) Resp:  [14-18] 14 (11/05 0629) BP: (113-121)/(73-78) 113/73 (11/05 0629) SpO2:  [98 %-100 %] 98 % (11/05 0629) Last BM Date: 02/18/17  Intake/Output from previous day: 11/04 0701 - 11/05 0700 In: 2169.2 [P.O.:840; I.V.:1179.2; IV Piggyback:150] Out: -  Intake/Output this shift: No intake/output data recorded.  PE: Gen:  Alert, NAD, pleasant, cooperative Card:  RRR, no M/G/R heard Pulm:  CTA, no W/R/R, effort normal Abd: Soft, not distended, +BS, no HSM, mild TTP in RUQ no guarding Skin: no rashes noted, warm and dry   Anti-infectives: Anti-infectives (From admission, onward)   Start     Dose/Rate Route Frequency Ordered Stop   02/15/17 1400  cefTRIAXone (ROCEPHIN) 2 g in dextrose 5 % 50 mL IVPB     2 g 100 mL/hr over 30 Minutes Intravenous Every 24 hours 02/15/17 1156        Lab Results:  Recent Labs     02/17/17 0629 02/18/17 1114  WBC 10.0 9.2  HGB 14.0 15.5*  HCT 42.5 44.7  PLT 341 323   BMET Recent Labs    02/18/17 1114  NA 136  K 4.0  CL 104  CO2 23  GLUCOSE 108*  BUN 8  CREATININE 0.86  CALCIUM 9.5   PT/INR No results for input(s): LABPROT, INR in the last 72 hours. CMP     Component Value Date/Time   NA 136 02/18/2017 1114   K 4.0 02/18/2017 1114   CL 104 02/18/2017 1114   CO2 23 02/18/2017 1114   GLUCOSE 108 (H) 02/18/2017 1114   BUN 8 02/18/2017 1114   CREATININE 0.86 02/18/2017 1114   CALCIUM 9.5 02/18/2017 1114   PROT 7.9 02/18/2017 1114   ALBUMIN 3.9 02/18/2017 1114   AST 22 02/18/2017 1114   ALT 20 02/18/2017 1114   ALKPHOS 79 02/18/2017 1114   BILITOT 0.5 02/18/2017 1114   GFRNONAA >60 02/18/2017 1114   GFRAA >60 02/18/2017 1114   Lipase     Component Value Date/Time   LIPASE 24 02/18/2017 1114    Studies/Results: No results found.    Jerre SimonJessica L Korbin Mapps , [facility] 02/19/2017, 3:07 PM Pager: [phone] Consults: [phone] Mon-Fri 7:00 am-4:30 pm Sat-Sun 7:00 am-11:30 am

## 2017-02-20 ENCOUNTER — Encounter (HOSPITAL_COMMUNITY): Admission: EM | Disposition: A | Discharge status: Home / Self Care | Payer: Self-pay | Source: Home / Self Care

## 2017-02-20 SURGERY — LAPAROSCOPIC CHOLECYSTECTOMY SINGLE SITE
Anesthesia: General

## 2017-02-20 NOTE — Discharge Summary (Signed)
[facility] Discharge Summary   Patient ID: Jill Aguilar MRN: [id number] DOB/AGE: [birth year] 26 y.o.  Admit date: 02/15/2017 Discharge date: 02/20/2017  Admitting Diagnosis: Symptomatic cholelithiasis Early pregnancy  Discharge Diagnosis Patient Active Problem List   Diagnosis Date Noted  . Symptomatic cholelithiasis 02/15/2017  . Calculus of gallbladder with chronic cholecystitis without obstruction   . 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 PTSD (post-traumatic stress disorder) 03/16/2015  . ADHD (attention deficit hyperactivity disorder), combined type 03/16/2015    Consultants none  Imaging: No results found.  Procedures none  HPI: Patient is a 26 y.o. Female [facility]. [facility] seen at [facility]/11/17 and had RUQ US and CT that showed cholelithiasis and gallbladder wall thickening, WBC was 11.8 at that time. Patient was discharged from ED 10/28. Patient continues to have abdominal pain in the RUQ, LUQ and pain that radiates to chest and right shoulder. Patient also reports chills, nausea, polyuria, pain with deep inspiration, diarrhea. Denies fever, palpitations, SOB, blood in stool, dysuria. Patient PMH significant for pre-diabetes, does not take any daily medications. Past surgical history significant for cesarean section 8 years ago. 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 Patient denies tobacco use past or present. Reports 5-6 alcoholic drinks per week, occasional marijuana use.   Hospital Course:  Workup showed cholelithiasis.  Patient was admitted. It was discussed with the pt that having surgery in her first trimester would put her at a higher risk for miscarriage and it would be ideal to get into her 2nd trimester before having her gallbladder removed. Pt was placed on IV antibiotics. Pt was started on clear liquids and her diet advanced as tolerated. She had one evening on soft diet where  she experienced pain. Discussed low fat diet with pt and she decided to try one more day of low fat food and see how she felt. On 11/06, pt had tolerated her diet with out pain, VSS, no WBC, and felt stable for discharge home. Patient will follow up in our office when she is in her 2nd trimester and knows to call with questions or concerns.   Patient was discharged in good condition.  Physical Exam: General:  Alert, NAD, pleasant, cooperative Cardio: RRR, S1 & S2 normal, no murmur, rubs, gallops Resp: Effort normal, lungs CTA bilaterally, no wheezes, rales, rhonchi Abd:  Soft, ND, normal bowel sounds, no tenderness, no HSM Skin: warm and dry, no rashes noted  Allergies as of 02/20/2017   No Known Allergies     Medication List    TAKE these medications   famotidine 20 MG tablet Commonly known as:  PEPCID Take 1 tablet (20 mg total) by mouth 2 (two) times daily.   methylphenidate 18 MG CR tablet Commonly known as:  CONCERTA Take 1 tablet (18 mg total) by mouth 2 (two) times daily with breakfast and lunch.        Follow-up Information    [facility] [place].   Specialty:  General Surgery Why:  to schedule a follow up appointment with one of our surgeons for gallbladder removal in your second trimester Contact information: [address] [place] [id number] [phone]          Signed: Joyce CopaJessica L [facility] 02/20/2017, 8:08 AM Pager: [phone] Consults: [phone] Mon-Fri 7:00 am-4:30 pm Sat-Sun 7:00 am-11:30 am

## 2017-02-20 NOTE — Progress Notes (Signed)
Patient discharged to home w/ SO. Patient verbalized understanding of all instructions. Ambulated to POV via request, refused w/c. Encouraged to follow up w/ [facility] and OBGyn.

## 2017-02-20 NOTE — Plan of Care (Signed)
Patient status adequate for d/c

## 2017-02-20 NOTE — Discharge Instructions (Signed)
Low-Fat Diet for Pancreatitis or Gallbladder Conditions A low-fat diet can be helpful if you have pancreatitis or a gallbladder condition. With these conditions, your pancreas and gallbladder have trouble digesting fats. A healthy eating plan with less fat will help rest your pancreas and gallbladder and reduce your symptoms. What do I need to know about this diet?  Eat a low-fat diet. ? Reduce your fat intake to less than 20-30% of your total daily calories. This is less than 50-60 g of fat per day. ? Remember that you need some fat in your diet. Ask your dietician what your daily goal should be. ? Choose nonfat and low-fat healthy foods. Look for the words nonfat, low fat, or fat free. ? As a guide, look on the label and choose foods with less than 3 g of fat per serving. Eat only one serving.  Avoid alcohol.  Do not smoke. If you need help quitting, talk with your health care provider.  Eat small frequent meals instead of three large heavy meals. What foods can I eat? Grains Include healthy grains and starches such as potatoes, wheat bread, fiber-rich cereal, and brown rice. Choose whole grain options whenever possible. In adults, whole grains should account for 45-65% of your daily calories. Fruits and Vegetables Eat plenty of fruits and vegetables. Fresh fruits and vegetables add fiber to your diet. Meats and Other Protein Sources Eat lean meat such as chicken and pork. Trim any fat off of meat before cooking it. Eggs, fish, and beans are other sources of protein. In adults, these foods should account for 10-35% of your daily calories. Dairy Choose low-fat milk and dairy options. Dairy includes fat and protein, as well as calcium. Fats and Oils Limit high-fat foods such as fried foods, sweets, baked goods, sugary drinks. Other Creamy sauces and condiments, such as mayonnaise, can add extra fat. Think about whether or not you need to use them, or use smaller amounts or low fat  options. What foods are not recommended?  High fat foods, such as: ? [employer]. ? Ice cream. ? [place] toast. ? Sweet rolls. ? Pizza. ? Cheese bread. ? Foods covered with batter, butter, creamy sauces, or cheese. ? Fried foods. ? Sugary drinks and desserts.  Foods that cause gas or bloating This information is not intended to replace advice given to you by your health care provider. Make sure you discuss any questions you have with your health care provider. Document Released: 04/08/2013 Document Revised: 09/09/2015 Document Reviewed: 03/17/2013 Elsevier Interactive Patient Education  2017 Elsevier Inc.   Cholelithiasis Cholelithiasis is a form of gallbladder disease in which gallstones form in the gallbladder. The gallbladder is an organ that stores bile. Bile is made in the liver, and it helps to digest fats. Gallstones begin as small crystals and slowly grow into stones. They may cause no symptoms until the gallbladder tightens (contracts) and a gallstone is blocking the duct (gallbladder attack), which can cause pain. Cholelithiasis is also referred to as gallstones. There are two main types of gallstones:  Cholesterol stones. These are made of hardened cholesterol and are usually yellow-green in color. They are the most common type of gallstone. Cholesterol is a white, waxy, fat-like substance that is made in the liver.  Pigment stones. These are dark in color and are made of a red-yellow substance that forms when hemoglobin from red blood cells breaks down (bilirubin).  What are the causes? This condition may be caused by an imbalance in the substances  that bile is made of. This can happen if the bile:  Has too much bilirubin.  Has too much cholesterol.  Does not have enough bile salts. These salts help the body absorb and digest fats.  In some cases, this condition can also be caused by the gallbladder not emptying completely or often enough. What increases the  risk? The following factors may make you more likely to develop this condition:  Being female.  Having multiple pregnancies. Health care providers sometimes advise removing diseased gallbladders before future pregnancies.  Eating a diet that is heavy in fried foods, fat, and refined carbohydrates, like white bread and white rice.  Being obese.  Being older than age 27.  Prolonged use of medicines that contain female hormones (estrogen).  Having diabetes mellitus.  Rapidly losing weight.  Having a family history of gallstones.  Being of American [place] or [place] descent.  Having an intestinal disease such as Crohn disease.  Having metabolic syndrome.  Having cirrhosis.  Having severe types of anemia such as sickle cell anemia.  What are the signs or symptoms? In most cases, there are no symptoms. These are known as silent gallstones. If a gallstone blocks the bile ducts, it can cause a gallbladder attack. The main symptom of a gallbladder attack is sudden pain in the upper right abdomen. The pain usually comes at night or after eating a large meal. The pain can last for one or several hours and can spread to the right shoulder or chest. If the bile duct is blocked for more than a few hours, it can cause infection or inflammation of the gallbladder, liver, or pancreas, which may cause:  Nausea.  Vomiting.  Abdominal pain that lasts for 5 hours or more.  Fever or chills.  Yellowing of the skin or the whites of the eyes (jaundice).  Dark urine.  Light-colored stools.  How is this diagnosed? This condition may be diagnosed based on:  A physical exam.  Your medical history.  An ultrasound of your gallbladder.  CT scan.  MRI.  Blood tests to check for signs of infection or inflammation.  A scan of your gallbladder and bile ducts (biliary system) using nonharmful radioactive material and special cameras that can see the radioactive material (cholescintigram).  This test checks to see how your gallbladder contracts and whether bile ducts are blocked.  Inserting a small tube with a camera on the end (endoscope) through your mouth to inspect bile ducts and check for blockages (endoscopic retrograde cholangiopancreatogram).  How is this treated? Treatment for gallstones depends on the severity of the condition. Silent gallstones do not need treatment. If the gallstones cause a gallbladder attack or other symptoms, treatment may be required. Options for treatment include:  Surgery to remove the gallbladder (cholecystectomy). This is the most common treatment.  Medicines to dissolve gallstones. These are most effective at treating small gallstones. You may need to take medicines for up to 6-12 months.  Shock wave treatment (extracorporeal biliary lithotripsy). In this treatment, an ultrasound machine sends shock waves to the gallbladder to break gallstones into smaller pieces. These pieces can then be passed into the intestines or be dissolved by medicine. This is rarely used.  Removing gallstones through endoscopic retrograde cholangiopancreatogram. A small basket can be attached to the endoscope and used to capture and remove gallstones.  Follow these instructions at home:  Take over-the-counter and prescription medicines only as told by your health care provider.  Maintain a healthy weight and follow a  healthy diet. This includes: ? Reducing fatty foods, such as fried food. ? Reducing refined carbohydrates, like white bread and white rice. ? Increasing fiber. Aim for foods like almonds, fruit, and beans.  Keep all follow-up visits as told by your health care provider. This is important. Contact a health care provider if:  You think you have had a gallbladder attack.  You have been diagnosed with silent gallstones and you develop abdominal pain or indigestion. Get help right away if:  You have pain from a gallbladder attack that lasts for more  than 2 hours.  You have abdominal pain that lasts for more than 5 hours.  You have a fever or chills.  You have persistent nausea and vomiting.  You develop jaundice.  You have dark urine or light-colored stools. Summary  Cholelithiasis (also called gallstones) is a form of gallbladder disease in which gallstones form in the gallbladder.  This condition is caused by an imbalance in the substances that make up bile. This can happen if the bile has too much cholesterol, too much bilirubin, or not enough bile salts.  You are more likely to develop this condition if you are female, pregnant, using medicines with estrogen, obese, older than age 27, or have a family history of gallstones. You may also develop gallstones if you have diabetes, an intestinal disease, cirrhosis, or metabolic syndrome.  Treatment for gallstones depends on the severity of the condition. Silent gallstones do not need treatment.  If gallstones cause a gallbladder attack or other symptoms, treatment may be needed. The most common treatment is surgery to remove the gallbladder. This information is not intended to replace advice given to you by your health care provider. Make sure you discuss any questions you have with your health care provider. Document Released: 03/30/2005 Document Revised: 12/19/2015 Document Reviewed: 12/19/2015 Elsevier Interactive Patient Education  2017 [employer]

## 2017-03-06 LAB — OB RESULTS CONSOLE HIV ANTIBODY (ROUTINE TESTING): HIV: NONREACTIVE

## 2017-03-06 LAB — OB RESULTS CONSOLE RUBELLA ANTIBODY, IGM: RUBELLA: IMMUNE

## 2017-03-06 LAB — OB RESULTS CONSOLE HEPATITIS B SURFACE ANTIGEN: Hepatitis B Surface Ag: NEGATIVE

## 2017-03-06 LAB — OB RESULTS CONSOLE ABO/RH: RH Type: POSITIVE

## 2017-03-06 LAB — OB RESULTS CONSOLE RPR: RPR: NONREACTIVE

## 2017-03-06 LAB — OB RESULTS CONSOLE ANTIBODY SCREEN: Antibody Screen: POSITIVE

## 2017-03-06 LAB — OB RESULTS CONSOLE GC/CHLAMYDIA
CHLAMYDIA, DNA PROBE: NEGATIVE
GC PROBE AMP, GENITAL: NEGATIVE

## 2017-03-07 ENCOUNTER — Encounter (HOSPITAL_COMMUNITY): Payer: Self-pay | Admitting: *Deleted

## 2017-03-07 ENCOUNTER — Inpatient Hospital Stay (HOSPITAL_COMMUNITY): Payer: Medicaid Other

## 2017-03-07 ENCOUNTER — Inpatient Hospital Stay (HOSPITAL_COMMUNITY)
Admission: AD | Admit: 2017-03-07 | Discharge: 2017-03-09 | DRG: 832 | Disposition: A | Discharge status: Home / Self Care | Payer: Medicaid Other | Source: Ambulatory Visit | Attending: Obstetrics and Gynecology | Admitting: Obstetrics and Gynecology

## 2017-03-07 DIAGNOSIS — Z3A01 Less than 8 weeks gestation of pregnancy: Secondary | ICD-10-CM

## 2017-03-07 DIAGNOSIS — R112 Nausea with vomiting, unspecified: Secondary | ICD-10-CM | POA: Diagnosis present

## 2017-03-07 DIAGNOSIS — K8020 Calculus of gallbladder without cholecystitis without obstruction: Secondary | ICD-10-CM | POA: Diagnosis present

## 2017-03-07 DIAGNOSIS — K8010 Calculus of gallbladder with chronic cholecystitis without obstruction: Secondary | ICD-10-CM | POA: Diagnosis present

## 2017-03-07 DIAGNOSIS — O210 Mild hyperemesis gravidarum: Principal | ICD-10-CM | POA: Diagnosis present

## 2017-03-07 DIAGNOSIS — O219 Vomiting of pregnancy, unspecified: Secondary | ICD-10-CM | POA: Diagnosis present

## 2017-03-07 DIAGNOSIS — O3680X0 Pregnancy with inconclusive fetal viability, not applicable or unspecified: Secondary | ICD-10-CM

## 2017-03-07 DIAGNOSIS — O99611 Diseases of the digestive system complicating pregnancy, first trimester: Secondary | ICD-10-CM | POA: Diagnosis present

## 2017-03-07 LAB — POCT PREGNANCY, URINE: Preg Test, Ur: POSITIVE — AB

## 2017-03-07 LAB — URINALYSIS, ROUTINE W REFLEX MICROSCOPIC
Bilirubin Urine: NEGATIVE
GLUCOSE, UA: NEGATIVE mg/dL
Ketones, ur: 80 mg/dL — AB
LEUKOCYTES UA: NEGATIVE
NITRITE: NEGATIVE
Protein, ur: 100 mg/dL — AB
SPECIFIC GRAVITY, URINE: 1.029 (ref 1.005–1.030)
pH: 5 (ref 5.0–8.0)

## 2017-03-07 LAB — CBC WITH DIFFERENTIAL/PLATELET
Basophils Absolute: 0 10*3/uL (ref 0.0–0.1)
Basophils Relative: 0 %
Eosinophils Absolute: 0.1 10*3/uL (ref 0.0–0.7)
Eosinophils Relative: 1 %
HEMATOCRIT: 43 % (ref 36.0–46.0)
Hemoglobin: 14.6 g/dL (ref 12.0–15.0)
LYMPHS ABS: 2.2 10*3/uL (ref 0.7–4.0)
LYMPHS PCT: 24 %
MCH: 30 pg (ref 26.0–34.0)
MCHC: 34 g/dL (ref 30.0–36.0)
MCV: 88.5 fL (ref 78.0–100.0)
MONO ABS: 0.4 10*3/uL (ref 0.1–1.0)
Monocytes Relative: 4 %
NEUTROS ABS: 6.6 10*3/uL (ref 1.7–7.7)
Neutrophils Relative %: 71 %
Platelets: 327 10*3/uL (ref 150–400)
RBC: 4.86 MIL/uL (ref 3.87–5.11)
RDW: 12.3 % (ref 11.5–15.5)
WBC: 9.3 10*3/uL (ref 4.0–10.5)

## 2017-03-07 LAB — COMPREHENSIVE METABOLIC PANEL
ALT: 21 U/L (ref 14–54)
ANION GAP: 13 (ref 5–15)
AST: 20 U/L (ref 15–41)
Albumin: 4.3 g/dL (ref 3.5–5.0)
Alkaline Phosphatase: 75 U/L (ref 38–126)
BILIRUBIN TOTAL: 1.1 mg/dL (ref 0.3–1.2)
BUN: 13 mg/dL (ref 6–20)
CO2: 17 mmol/L — ABNORMAL LOW (ref 22–32)
Calcium: 9.8 mg/dL (ref 8.9–10.3)
Chloride: 105 mmol/L (ref 101–111)
Creatinine, Ser: 0.83 mg/dL (ref 0.44–1.00)
GFR calc Af Amer: >60 mL/min (ref >60)
Glucose, Bld: 74 mg/dL (ref 65–99)
POTASSIUM: 3.9 mmol/L (ref 3.5–5.1)
Sodium: 135 mmol/L (ref 135–145)
Total Protein: 8.2 g/dL — ABNORMAL HIGH (ref 6.5–8.1)

## 2017-03-07 LAB — LIPASE, BLOOD: LIPASE: 20 U/L (ref 11–51)

## 2017-03-07 LAB — HEMOGLOBIN A1C
HEMOGLOBIN A1C: 5.4 % (ref 4.8–5.6)
Mean Plasma Glucose: 108.28 mg/dL

## 2017-03-07 LAB — TSH: TSH: 0.387 u[IU]/mL (ref 0.350–4.500)

## 2017-03-07 MED ORDER — DIPHENHYDRAMINE HCL 50 MG/ML IJ SOLN: 25.0000 mg | Freq: Once | INTRAMUSCULAR | Status: DC | PRN

## 2017-03-07 MED ORDER — FAMOTIDINE IN NACL 20-0.9 MG/50ML-% IV SOLN
20.0000 mg | Freq: Once | INTRAVENOUS | Status: AC
  Administered 2017-03-07: 20 mg via INTRAVENOUS
  Filled 2017-03-07: qty 50

## 2017-03-07 MED ORDER — ZOLPIDEM TARTRATE 5 MG PO TABS: 5.0000 mg | ORAL_TABLET | Freq: Every evening | ORAL | Status: DC | PRN

## 2017-03-07 MED ORDER — LACTATED RINGERS IV BOLUS (SEPSIS)
1000.0000 mL | Freq: Once | INTRAVENOUS | Status: AC
  Administered 2017-03-07: 1000 mL via INTRAVENOUS

## 2017-03-07 MED ORDER — BUTORPHANOL TARTRATE 1 MG/ML IJ SOLN
2.0000 mg | Freq: Once | INTRAMUSCULAR | Status: AC
  Administered 2017-03-07: 2 mg via INTRAVENOUS
  Filled 2017-03-07: qty 2

## 2017-03-07 MED ORDER — SODIUM CHLORIDE 0.9 % IV SOLN
8.0000 mg | Freq: Once | INTRAVENOUS | Status: AC | PRN
  Administered 2017-03-07: 8 mg via INTRAVENOUS
  Filled 2017-03-07: qty 4

## 2017-03-07 MED ORDER — CALCIUM CARBONATE ANTACID 500 MG PO CHEW: 2.0000 | CHEWABLE_TABLET | ORAL | Status: DC | PRN

## 2017-03-07 MED ORDER — DEXTROSE IN LACTATED RINGERS 5 % IV SOLN
Freq: Once | INTRAVENOUS | Status: AC
  Administered 2017-03-07: 15:00:00 via INTRAVENOUS

## 2017-03-07 MED ORDER — FAMOTIDINE 20 MG PO TABS
20.0000 mg | ORAL_TABLET | Freq: Two times a day (BID) | ORAL | Status: DC
  Administered 2017-03-07 – 2017-03-09 (×4): 20 mg via ORAL
  Filled 2017-03-07 (×5): qty 1

## 2017-03-07 MED ORDER — PROMETHAZINE HCL 25 MG/ML IJ SOLN
25.0000 mg | Freq: Once | INTRAMUSCULAR | Status: AC
  Administered 2017-03-07: 25 mg via INTRAVENOUS
  Filled 2017-03-07: qty 1

## 2017-03-07 MED ORDER — ACETAMINOPHEN 325 MG PO TABS
650.0000 mg | ORAL_TABLET | ORAL | Status: DC | PRN
  Administered 2017-03-08 – 2017-03-09 (×2): 650 mg via ORAL
  Filled 2017-03-07 (×2): qty 2

## 2017-03-07 MED ORDER — LACTATED RINGERS IV SOLN
INTRAVENOUS | Status: DC
  Administered 2017-03-07 – 2017-03-09 (×4): via INTRAVENOUS

## 2017-03-07 MED ORDER — BUTORPHANOL TARTRATE 1 MG/ML IJ SOLN
2.0000 mg | INTRAMUSCULAR | Status: DC | PRN
  Administered 2017-03-08: 2 mg via INTRAVENOUS
  Filled 2017-03-07: qty 2

## 2017-03-07 MED ORDER — PROMETHAZINE HCL 25 MG/ML IJ SOLN
12.5000 mg | Freq: Four times a day (QID) | INTRAMUSCULAR | Status: DC | PRN
Start: 2017-03-07 — End: 2017-03-08
  Administered 2017-03-07 – 2017-03-08 (×3): 12.5 mg via INTRAVENOUS
  Filled 2017-03-07 (×3): qty 1

## 2017-03-07 MED ORDER — METHYLPHENIDATE HCL ER (OSM) 18 MG PO TBCR: 18.0000 mg | EXTENDED_RELEASE_TABLET | Freq: Two times a day (BID) | ORAL | Status: DC

## 2017-03-07 NOTE — MAU Provider Note (Signed)
History     CSN: [id number]  Arrival date and time: 03/07/17 1140   None     Chief Complaint  Patient presents with  . Nausea  . Emesis   HPI Pt presents c/o n/v since Saturday.  Minimal abdominal pain, feels nothing like her acute cholecystitis flares.  She is followed by general surgery for cholelithiasis and bouts of cholecystitis.  They are considering surgery in the second trimester.  Pt has not had an ultrasound to confirm dating yet.  She had her interview at [facility] recently and is scheduled for u/s and new ob w/u on 04/03/17.  She also reports diarrhea but no fever or chills.  OB History    Gravida Para Term Preterm AB Living   2 1 1  0 0 1   SAB TAB Ectopic Multiple Live Births   0 0 0   1      Past Medical History:  Diagnosis Date  . Anxiety   . Depression     Past Surgical History:  Procedure Laterality Date  . CESAREAN SECTION    . WISDOM TOOTH EXTRACTION Bilateral 10/2002    History reviewed. No pertinent family history.  Social History   Tobacco Use  . Smoking status: Never Smoker  . Smokeless tobacco: Never Used  Substance Use Topics  . Alcohol use: Yes    Alcohol/week: 3.6 oz    Types: 2 Glasses of wine, 2 Cans of beer, 2 Shots of liquor per week  . Drug use: Yes    Frequency: 1.0 times per week    Types: Marijuana    Allergies: No Known Allergies  Medications Prior to Admission  Medication Sig Dispense Refill Last Dose  . famotidine (PEPCID) 20 MG tablet Take 1 tablet (20 mg total) by mouth 2 (two) times daily. 60 tablet 0 02/14/2017 at Unknown time  . methylphenidate (CONCERTA) 18 MG PO CR tablet Take 1 tablet (18 mg total) by mouth 2 (two) times daily with breakfast and lunch. (Patient not taking: Reported on 02/11/2017) 60 tablet 0 Not Taking at Unknown time    Review of Systems Physical Exam   Blood pressure 125/81, pulse (!) 110, resp. rate 15, height 5' 6.5" (1.689 m), weight 77.1 kg (170 lb), last menstrual period  01/15/2017.  Physical Exam  Lungs CTA CVRRR Abd soft, mild discomfort Ext no calf tenderness  MAU Course  Procedures  MDM Labs  U/S IVF Anti-nausea meds  Assessment and Plan  G2P1 with LMP 01/13/17 at 7 4/7wks presenting with n/v/d and minimal abdominal discomfort.  The abdominal discomfort is nothing like what she has felt with her prior episodes of acute cholecystitis.  Will give IVF, anti-nausea meds, check labs and ultrasound to confirm dating.  Prelim of UA reviewed.  Will send urine for culture.  Purcell Nailsngela Y Rielle Schlauch 03/07/2017, 1:27 PM   [facility] RN [facility]  Pt starting to feel upper gastric pain similar to the pain with her acute cholecystitis.  MAU midlevel asked to see pt by staff.  Call from midlevel while I was still in OR asking for pain medicine and pain was not chest pain.  I asked them to admit the patient and I will have orders placed when I finish in OR.  I signed [facility] to [facility] and asked her to see pt as well and place admission orders.  Instructions given to call general surgery as indicated.  I will sign out to on call MD,  Dr. Dion BodyVarnado, as well.

## 2017-03-07 NOTE — MAU Note (Signed)
Pt reports she has not been able to est or drink much of anything since Saturday. Vomiting about 5 times a day with diarrhea as well. Taking diclegis without relief

## 2017-03-07 NOTE — H&P (Signed)
Jill Aguilar is a 26 y.o. female, G2P1001 at 6.1 weeks, presenting for nausea and vomiting.  Patient also c/o abdominal pain and diarrhea, but denies fever and chills. Patient is under the care of [facility] and pregnancy and medical history significant for cholelithiasis and bouts of cholecystitis with a hospitalization the 1st week of November. Patient currently taking concerta 18mg  at breakfast and lunch, while maintaining a bland diet that is low in fat.  Patient Active Problem List   Diagnosis Date Noted  . Nausea and vomiting in pregnancy 03/07/2017  . Nausea and vomiting in pregnancy prior to [redacted] weeks gestation 03/07/2017  . Symptomatic cholelithiasis 02/15/2017  . Calculus of gallbladder with chronic cholecystitis without obstruction   . 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 PTSD (post-traumatic stress disorder) 03/16/2015  . ADHD (attention deficit hyperactivity disorder), combined type 03/16/2015    History of present pregnancy: Patient entered care at 5 weeks.   EDC of 10/30/2017 was established by 6.1 wk US.   Anatomy scan:  N/A weeks Additional US evaluations:   Dating US on 11/21 Significant prenatal events: Patient currently being followed by general surgery for cholelithiasis and cholecystitis.  Last evaluation:  NOB Interview on 03/06/2017  OB History    Gravida Para Term Preterm AB Living   2 1 1  0 0 1   SAB TAB Ectopic Multiple Live Births   0 0 0   1     Past Medical History:  Diagnosis Date  . Anxiety   . Depression    Past Surgical History:  Procedure Laterality Date  . CESAREAN SECTION    . WISDOM TOOTH EXTRACTION Bilateral 10/2002   Family History: family history is not on file. Social History:  reports that  has never smoked. she has never used smokeless tobacco. She reports that she drinks about 3.6 oz of alcohol per week. She reports that she uses drugs. Drug: Marijuana. Frequency: 1.00 time per week.   Prenatal Transfer Tool  Maternal Diabetes:  No Genetic Screening: N/A d/t Dates Maternal Ultrasounds/Referrals: Normal Fetal Ultrasounds or other Referrals:  Other:  Maternal Substance Abuse:  Yes:  Type: Marijuana Significant Maternal Medications:  Meds include: Other: Concerta Significant Maternal Lab Results: None    ROS:  No current pain Diarrhea   No Known Allergies     Blood pressure 120/61, pulse 69, temperature 97.8 F (36.6 C), temperature source Oral, resp. rate 18, height 5' 6.5" (1.689 m), weight 77.1 kg (170 lb), last menstrual period 01/13/2017, SpO2 100 %.  Physical Exam  Constitutional: She is oriented to person, place, and time. She appears well-developed and well-nourished. No distress.  HENT:  Head: Normocephalic and atraumatic.  Eyes: Conjunctivae are normal.  Neck: Normal range of motion.  Cardiovascular: Normal rate, regular rhythm and normal heart sounds.  Respiratory: Effort normal and breath sounds normal.  GI: Soft. Bowel sounds are decreased. There is tenderness (None currently) in the epigastric area.  Musculoskeletal: Normal range of motion. She exhibits no edema.  Neurological: She is alert and oriented to person, place, and time.  Skin: Skin is warm and dry.  Psychiatric: She has a normal mood and affect. Her behavior is normal.      FHR: 112 by US   Prenatal labs: ABO, Rh:  Pending Antibody:  Pending Rubella:  Pending RPR:   Pending HBsAg:   Pending HIV:   Pending GBS:  N/A Sickle cell/Hgb electrophoresis:  Pending Pap:  Uknown GC:  Pending Chlamydia:  Pending Other:  None  Assessment IUP at 6.1wks N/V Cholelithiasis  Cholecystitis  Plan: Admit to Antepartum for observation per consult with Dr.A.Roberts Routine Antepartum Orders per [facility] Guideline Clear Liquid Diet Advanced Diet in Am Concerta as taken Pepcid 20mg  BID No PNV until N/V subsides No colace until diarrhea resolves Phenergan 12.5mg  IV prn for N/V Stadol 2mg  Q6hrs prn for pain Consider  discharge in AM dependent upon patient status If symptoms worsen, contact general surgery for consult   Joellyn QuailsJessica L EmlyCNM, MSN 03/07/2017, 10:17 PM

## 2017-03-07 NOTE — MAU Note (Signed)
Bhambri, CNM at bedside, reports pain is epigastric and not chest pain. States RN may call Dr Su Hiltoberts back and request pain meds for pt.

## 2017-03-08 ENCOUNTER — Other Ambulatory Visit: Payer: Self-pay

## 2017-03-08 DIAGNOSIS — Z3A01 Less than 8 weeks gestation of pregnancy: Secondary | ICD-10-CM | POA: Diagnosis not present

## 2017-03-08 DIAGNOSIS — K8010 Calculus of gallbladder with chronic cholecystitis without obstruction: Secondary | ICD-10-CM | POA: Diagnosis present

## 2017-03-08 DIAGNOSIS — R112 Nausea with vomiting, unspecified: Secondary | ICD-10-CM | POA: Diagnosis present

## 2017-03-08 DIAGNOSIS — O210 Mild hyperemesis gravidarum: Secondary | ICD-10-CM | POA: Diagnosis present

## 2017-03-08 DIAGNOSIS — O99611 Diseases of the digestive system complicating pregnancy, first trimester: Secondary | ICD-10-CM | POA: Diagnosis present

## 2017-03-08 LAB — CULTURE, OB URINE: Culture: <10000 — AB

## 2017-03-08 MED ORDER — SODIUM CHLORIDE 0.9 % IV SOLN
8.0000 mg | Freq: Three times a day (TID) | INTRAVENOUS | Status: DC
  Administered 2017-03-08 – 2017-03-09 (×4): 8 mg via INTRAVENOUS
  Filled 2017-03-08 (×5): qty 4

## 2017-03-08 MED ORDER — PROMETHAZINE HCL 25 MG/ML IJ SOLN
12.5000 mg | INTRAMUSCULAR | Status: DC
  Administered 2017-03-08 – 2017-03-09 (×3): 12.5 mg via INTRAVENOUS
  Filled 2017-03-08 (×4): qty 1

## 2017-03-08 NOTE — Progress Notes (Addendum)
Hospital day # 1 pregnancy at 4466w2d--N/V, known cholelithiasis/cholecystitis  S:  Feeling better this am, minimal pain, some nausea, but no vomiting.      Tolerating po fluids, has not tried to eat since trial of graham cracker in MAU, with severe reflux as outcome.  Last pain med--Stadol 2 mg IV at 1906 Last anti-emetic--Phenergan IV at 2338 Took Pepcid tablet at 2338.  Hoping to go home today.  [facility] with gallbladder wall thickening dx 02/11/17.  They are hoping to defer surgery at present, but if condition worsens, cholecystectomy will be performed.   O: BP 107/67 (BP Location: Left Arm)   Pulse 66   Temp 98.5 F (36.9 C) (Oral)   Resp 18   Ht 5' 6.5" (1.689 m)   Wt 77.1 kg (170 lb)   LMP 01/13/2017 (Exact Date)   SpO2 100%   BMI 27.03 kg/m    Vitals:   03/07/17 1955 03/07/17 2350 03/08/17 0618 03/08/17 0800  BP: 120/61 118/68 (!) 102/55 107/67  Pulse: 69 62 62 66  Resp: 18 18 17 18   Temp: 97.8 F (36.6 C) 98.2 F (36.8 C) 98 F (36.7 C) 98.5 F (36.9 C)  TempSrc: Oral Oral Oral Oral  SpO2: 100% 100% 100% 100%  Weight:      Height:       Viable pregnancy noted on US yesterday:  SIUP, 6 1/7 weeks, EDC 10/30/17, FHR 112.  Abdomen--soft, NT           Labs:   Results for orders placed or performed during the hospital encounter of 03/07/17 (from the past 24 hour(s))  Urinalysis, Routine w reflex microscopic     Status: Abnormal   Collection Time: 03/07/17 12:00 PM  Result Value Ref Range   Color, Urine YELLOW YELLOW   APPearance CLOUDY (A) CLEAR   Specific Gravity, Urine 1.029 1.005 - 1.030   pH 5.0 5.0 - 8.0   Glucose, UA NEGATIVE NEGATIVE mg/dL   Hgb urine dipstick MODERATE (A) NEGATIVE   Bilirubin Urine NEGATIVE NEGATIVE   Ketones, ur 80 (A) NEGATIVE mg/dL   Protein, ur 161100 (A) NEGATIVE mg/dL   Nitrite NEGATIVE NEGATIVE   Leukocytes, UA NEGATIVE NEGATIVE   RBC / HPF 6-30 0 - 5 RBC/hpf   WBC, UA 0-5 0 - 5 WBC/hpf    Bacteria, UA RARE (A) NONE SEEN   Squamous Epithelial / LPF 6-30 (A) NONE SEEN   Mucus PRESENT    Hyaline Casts, UA PRESENT   Pregnancy, urine POC     Status: Abnormal   Collection Time: 03/07/17 12:17 PM  Result Value Ref Range   Preg Test, Ur POSITIVE (A) NEGATIVE  CBC with Differential     Status: None   Collection Time: 03/07/17  1:21 PM  Result Value Ref Range   WBC 9.3 4.0 - 10.5 K/uL   RBC 4.86 3.87 - 5.11 MIL/uL   Hemoglobin 14.6 12.0 - 15.0 g/dL   HCT 09.643.0 04.536.0 - 40.946.0 %   MCV 88.5 78.0 - 100.0 fL   MCH 30.0 26.0 - 34.0 pg   MCHC 34.0 30.0 - 36.0 g/dL   RDW 81.112.3 91.411.5 - 78.215.5 %   Platelets 327 150 - 400 K/uL   Neutrophils Relative % 71 %   Neutro Abs 6.6 1.7 - 7.7 K/uL   Lymphocytes Relative 24 %   Lymphs Abs 2.2 0.7 - 4.0 K/uL   Monocytes Relative 4 %   Monocytes Absolute 0.4  0.1 - 1.0 K/uL   Eosinophils Relative 1 %   Eosinophils Absolute 0.1 0.0 - 0.7 K/uL   Basophils Relative 0 %   Basophils Absolute 0.0 0.0 - 0.1 K/uL  Comprehensive metabolic panel     Status: Abnormal   Collection Time: 03/07/17  1:21 PM  Result Value Ref Range   Sodium 135 135 - 145 mmol/L   Potassium 3.9 3.5 - 5.1 mmol/L   Chloride 105 101 - 111 mmol/L   CO2 17 (L) 22 - 32 mmol/L   Glucose, Bld 74 65 - 99 mg/dL   BUN 13 6 - 20 mg/dL   Creatinine, Ser 1.610.83 0.44 - 1.00 mg/dL   Calcium 9.8 8.9 - 09.610.3 mg/dL   Total Protein 8.2 (H) 6.5 - 8.1 g/dL   Albumin 4.3 3.5 - 5.0 g/dL   AST 20 15 - 41 U/L   ALT 21 14 - 54 U/L   Alkaline Phosphatase 75 38 - 126 U/L   Total Bilirubin 1.1 0.3 - 1.2 mg/dL   GFR calc non Af Amer >60 >60 mL/min   GFR calc Af Amer >60 >60 mL/min   Anion gap 13 5 - 15  Lipase, blood     Status: None   Collection Time: 03/07/17  1:21 PM  Result Value Ref Range   Lipase 20 11 - 51 U/L  TSH     Status: None   Collection Time: 03/07/17  1:22 PM  Result Value Ref Range   TSH 0.387 0.350 - 4.500 uIU/mL  Hemoglobin A1c     Status: None   Collection Time: 03/07/17  2:28  PM  Result Value Ref Range   Hgb A1c MFr Bld 5.4 4.8 - 5.6 %   Mean Plasma Glucose 108.28 mg/dL        Scheduled Meds:  . famotidine  20 mg Oral BID  . methylphenidate  18 mg Oral BID WC   acetaminophen, butorphanol, calcium carbonate, promethazine, zolpidem  A: 2114w2d with N/V, known cholestasis/cholecystitis     Stable  P: Continue current plan of care      Upcoming tests/treatments:  Advance diet this am to bland, observe tolerance      MDs will follow      Patient has NOB w/u scheduled at [facility] on 04/03/17, with US for f/u viability.  Nigel BridgemanVicki Zya Finkle CNM, [place] 03/08/2017 8:24 AM

## 2017-03-08 NOTE — Progress Notes (Signed)
In to see patient and check on status.  Tolerated oatmeal for breakfast, tried cream of chicken soup for lunch with vomiting.  Minimal baseline nausea since.  On Pepcid 20 mg BID On Methylphenidate 18 mg BID  Has Phenergan 12.5 mg IV q 6 prn--doses today at 0900 and 1513 No Zofran ordered.  Plan: Try non-cream based, non-acidic, low-fat foods. Change Phenergan to q 4 hours around the clock. Start Zofran 4 mg IV q 4 hours. If intolerant to any food tonight, return to clears or NPO, if needed. Re-evaluate d/c status in the am.  Nigel BridgemanVicki Ambika Aguilar, CNM 03/08/17 4:20p

## 2017-03-08 NOTE — Progress Notes (Signed)
Pt states she does not take Concerta.

## 2017-03-09 MED ORDER — PROMETHAZINE HCL 25 MG PO TABS: 25.0000 mg | ORAL_TABLET | Freq: Four times a day (QID) | ORAL | 2 refills | Status: DC | PRN

## 2017-03-09 MED ORDER — ONDANSETRON 4 MG PO TBDP: 4.0000 mg | ORAL_TABLET | Freq: Three times a day (TID) | ORAL | 2 refills | Status: DC | PRN

## 2017-03-09 NOTE — Progress Notes (Signed)
Hospital day # 2 pregnancy at 8423w3d--N/V, known cholelithiasis/cholecystitis  S:  Sleeping soundly  O: BP (!) 96/37 (BP Location: Right Arm) Comment: notified nurse  Pulse 64   Temp 98.1 F (36.7 C) (Oral)   Resp 17   Ht 5' 6.5" (1.689 m)   Wt 77.1 kg (170 lb)   LMP 01/13/2017 (Exact Date)   SpO2 100%   BMI 27.03 kg/m       Uterus non-tender      Extremities: no significant edema and no signs of DVT          Labs:  None       Scheduled meds:  . famotidine  20 mg Oral BID  . promethazine  12.5 mg Intravenous Q4H  Odansetron 8 mg IV q 8 hours.       PRN meds: acetaminophen, butorphanol, calcium carbonate, zolpidem  A: 723w3d with N/V, known cholelithiasis/cholecystitis     Stable  P: Continue current plan of care--will see when she awakens.      Upcoming tests/treatments:  IV hydration, antiemetics, weigh today      MDs will follow  Nigel BridgemanVicki Caelynn Marshman CNM, [place] 03/09/2017 7:32 AM

## 2017-03-09 NOTE — Discharge Summary (Signed)
Physician Discharge Summary  Patient ID: Jill Aguilar MRN: [id number] DOB/AGE: 06/13/1990 26 y.o.  Admit date: 03/07/2017 Discharge date: 03/09/2017  Admission Diagnoses:  Pregnancy at 6 weeks, N/V, known cholecystitis/cholilithiasis  Discharge Diagnoses:  Active Problems:   Calculus of gallbladder with chronic cholecystitis without obstruction   Symptomatic cholelithiasis   Nausea and vomiting in pregnancy   Nausea and vomiting   Discharged Condition: stable  Hospital Course: Admitted 03/07/17 with exacerbation of nausea/vomiting and abdominal pain, some diarrhea.  Known cholelithiasis and cholecystitis since early November, with surgical consult 02/15/17, plan made for cholecystectomy when necessary if sx worsen.  Had been on a low fat diet since the dx.  Pain resolved with single dose of IV Stadol in MAU, then patient was admitted for IV hydration and anti-emetics.  She gradually improved over the next 48 hours, and did not require any addition medications for pain.  She received IV hydration, Zofran and Phenergan IV.  By 03/09/17, she was tolerating fluid intake without issue, and was able to keep down small amounts of food.  She was d/c'd home on 11/23 to continue dietary management and use anti-emetics.  She is to keep her scheduled NOB w/u on 04/03/17, and is to call prn.  US during hospitalization on 03/07/17:   IMPRESSION: Single live intrauterine gestation with estimated gestational age of 716 1/7 weeks. No subchorionic hemorrhage. [facility]   Consults: None  Significant Diagnostic Studies: labs:  Results for orders placed or performed during the hospital encounter of 03/07/17 (from the past 48 hour(s))  Hemoglobin A1c     Status: None   Collection Time: 03/07/17  2:28 PM  Result Value Ref Range   Hgb A1c MFr Bld 5.4 4.8 - 5.6 %    Comment: (NOTE) Pre diabetes:          5.7%-6.4% Diabetes:              >6.4% Glycemic control for    <7.0% adults with diabetes    Mean Plasma Glucose 108.28 mg/dL    Comment: Performed at [facility], [address] [place], [place] [address]    Treatments: IV hydration  Discharge Exam: Blood pressure 119/64, pulse 63, temperature (!) 97.5 F (36.4 C), temperature source Oral, resp. rate 18, height 5' 6.5" (1.689 m), weight 80.3 kg (177 lb), last menstrual period 01/13/2017, SpO2 100 %. General appearance: alert Resp: clear to auscultation bilaterally Cardio: regular rate and rhythm, S1, S2 normal, no murmur, click, rub or gallop GI: soft, non-tender; bowel sounds normal; no masses,  no organomegaly Extremities: extremities normal, atraumatic, no cyanosis or edema  Disposition: 01-Home or Self Care  Discharge Instructions    Discharge patient   Complete by:  As directed    Discharge disposition:  01-Home or Self Care   Discharge patient date:  03/09/2017     Allergies as of 03/09/2017   No Known Allergies     Medication List    TAKE these medications   DICLEGIS PO Take 1 tablet by mouth daily.   famotidine 20 MG tablet Commonly known as:  PEPCID Take 1 tablet (20 mg total) by mouth 2 (two) times daily.   ondansetron 4 MG disintegrating tablet Commonly known as:  ZOFRAN ODT Take 1 tablet (4 mg total) by mouth every 8 (eight) hours as needed for nausea or vomiting.   promethazine 25 MG tablet Commonly known as:  PHENERGAN Take 1 tablet (25 mg total) by mouth every 6 (  six) hours as needed for nausea or vomiting.      Follow-up Information    [facility] up on 04/03/2017.   Specialty:  Obstetrics and Gynecology Why:  [facility], call as needed for any issues or concerns. Contact information: [address] [place] [phone]          Signed: Nigel BridgemanVicki Tulsi Crossett 03/09/2017, 1:37 PM

## 2017-03-09 NOTE — Discharge Instructions (Signed)
Take Zofran oral disintegrating tablet as needed for nausea, can cause constipation. Take phenergan for nausea, can cause sleepiness.  Morning Sickness Morning sickness is when you feel sick to your stomach (nauseous) during pregnancy. This nauseous feeling may or may not come with vomiting. It often occurs in the morning but can be a problem any time of day. Morning sickness is most common during the first trimester, but it may continue throughout pregnancy. While morning sickness is unpleasant, it is usually harmless unless you develop severe and continual vomiting (hyperemesis gravidarum). This condition requires more intense treatment. What are the causes? The cause of morning sickness is not completely known but seems to be related to normal hormonal changes that occur in pregnancy. What increases the risk? You are at greater risk if you:  Experienced nausea or vomiting before your pregnancy.  Had morning sickness during a previous pregnancy.  Are pregnant with more than one baby, such as twins.  How is this treated? Do not use any medicines (prescription, over-the-counter, or herbal) for morning sickness without first talking to your health care provider. Your health care provider may prescribe or recommend:  Vitamin B6 supplements.  Anti-nausea medicines.  The herbal medicine ginger.  Follow these instructions at home:  Only take over-the-counter or prescription medicines as directed by your health care provider.  Taking multivitamins before getting pregnant can prevent or decrease the severity of morning sickness in most women.  Eat a piece of dry toast or unsalted crackers before getting out of bed in the morning.  Eat five or six small meals a day.  Eat dry and bland foods (rice, baked potato). Foods high in carbohydrates are often helpful.  Do not drink liquids with your meals. Drink liquids between meals.  Avoid greasy, fatty, and spicy foods.  Get someone to cook  for you if the smell of any food causes nausea and vomiting.  If you feel nauseous after taking prenatal vitamins, take the vitamins at night or with a snack.  Snack on protein foods (nuts, yogurt, cheese) between meals if you are hungry.  Eat unsweetened gelatins for desserts.  Wearing an acupressure wristband (worn for sea sickness) may be helpful.  Acupuncture may be helpful.  Do not smoke.  Get a humidifier to keep the air in your house free of odors.  Get plenty of fresh air. Contact a health care provider if:  Your home remedies are not working, and you need medicine.  You feel dizzy or lightheaded.  You are losing weight. Get help right away if:  You have persistent and uncontrolled nausea and vomiting.  You pass out (faint). This information is not intended to replace advice given to you by your health care provider. Make sure you discuss any questions you have with your health care provider. Document Released: 05/25/2006 Document Revised: 09/09/2015 Document Reviewed: 09/18/2012 Elsevier Interactive Patient Education  2017 [employer]

## 2017-03-09 NOTE — Progress Notes (Signed)
Discharge teaching complete. Pt understood all information and did not have any questions. Pt discharged home to family. 

## 2017-04-17 NOTE — L&D Delivery Note (Signed)
Operative Delivery Note At 4:02 AM a viable female was delivered via VBAC, Vacuum Assisted.  Presentation: vertex; Position: Left,, Occiput,, Anterior; Station: +2.  Verbal consent: obtained from patient.  Risks and benefits discussed in detail.  Risks include, but are not limited to the risks of anesthesia, bleeding, infection, damage to maternal tissues, fetal cephalhematoma.  There is also the risk of inability to effect vaginal delivery of the head, or shoulder dystocia that cannot be resolved by established maneuvers, leading to the need for emergency cesarean section.  Vacuum applied for abnormal fetal tracing in second stage of labor (prolonged decelerations) and patient not pushing effectively.  1 application of vacuum, green zone pressure, 1 pull, no pop off. APGAR: 8, 9; weight  pending.   Placenta status: Grossly normal and intact with 3 vessel cord.    Cord:  with the following complications: nuchal .  Anesthesia:  1% Lidocaine local injection (15cc) Instruments: Kiwi Vacuum Episiotomy: None Lacerations: 2nd degree;Perineal Suture Repair: 3.0 vicryl Est. Blood Loss (mL): 300  Mom to postpartum.  Baby to Couplet care / Skin to Skin. Sanford BismarckJade [place] CNM assisted.  Konrad FelixKULWA,Merary Garguilo WAKURU, MD.  10/18/2017, 4:42 AM

## 2017-04-30 ENCOUNTER — Ambulatory Visit: Payer: Self-pay | Admitting: Surgery

## 2017-04-30 NOTE — H&P (Signed)
Jill Aguilar Documented: 04/30/2017 2:20 PM Location: [place] Office Patient #: [id number] DOB: 1990/07/07 Single / Language: [place] / Race: Black or African American Female  History of Present Illness Jill Sportsman MD; 04/30/2017 4:07 PM) The patient is a 27 year old female who presents for evaluation of gall stones. Note for "Gall stones": ` ` ` Patient sent for surgical consultation at the request of Dr. Osborn Coho  Chief Complaint: Symptomatic gallstones while pregnant.  The patient is a young female that had nausea and vomiting and came to the hospital in late October. Suspected gallbladder attack. [facility] consulted since she went to the [facility] emergency department. Her tach 1 away. They recommended close outpatient follow-up. She had another attack and went to [facility] long hospital. Suspected another attack. Workup revealed that she was Beta hCG positive. Confirmed to be pregnant. she was admitted given lack of any by mouth tolerance and dehydration. Dr. Abbey Chatters with our group was consulted. He tried to hold off on surgery. She improved & was discharged hospital day number 5. Unfortunately, she had recurring symptoms and was readmitted later in November for a few days to [facility]. Close follow up with obstetrics. She required IV fluids last month. She has been obese with a BMI 30 upon initial presentation, but now she has intentionally lost 30 pounds. She has a few days where she cannot keep much of anything down. She's tried to stick to just fruits and vegetables. Occasional low fat [place]. 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 she recalls being told by her nurse midwife that it is unlikely she will be able to make after her pregnancy without cholecystectomy. There had been a tentative plan to consider cholecystectomy at the second trimester since it is less risky of trimesters to operate. Hence  her coming in today to consider that option.  (Review of systems as stated in this history (HPI) or in the review of systems. Otherwise all other 12 point ROS are negative)   Past Surgical History Jill Aguilar, CMA; 04/30/2017 2:21 PM) Cesarean Section - 1 Oral Surgery Resection of Stomach  Diagnostic Studies History Jill Aguilar, CMA; 04/30/2017 2:21 PM) Colonoscopy never Mammogram >3 years ago Pap Smear 1-5 years ago  Allergies Jill Aguilar, CMA; 04/30/2017 2:23 PM) No Known Drug Allergies [04/30/2017]:  Medication History Jill Aguilar, CMA; 04/30/2017 2:23 PM) Prenatal Vitamins (Oral) Active. Medications Reconciled  Social History Jill Aguilar, CMA; 04/30/2017 2:21 PM) Alcohol use Recently quit alcohol use. Caffeine use Tea. No drug use Tobacco use Never smoker.  Family History Jill Aguilar, CMA; 04/30/2017 2:21 PM) Arthritis Family Members In General, Mother. Bleeding disorder Family Members In General. Diabetes Mellitus Family Members In General. Heart Disease Family Members In General. Hypertension Family Members In General. Migraine Headache Mother. Thyroid problems Family Members In General.  Pregnancy / Birth History Jill Aguilar, CMA; 04/30/2017 2:21 PM) Age at menarche 8 years. Contraceptive History Depo-provera, Intrauterine device, Oral contraceptives. Gravida 2 Irregular periods Maternal age 74-20 Para 1  Other Problems Jill Aguilar, CMA; 04/30/2017 2:21 PM) Bladder Problems Cholelithiasis Heart murmur High blood pressure     Review of Systems (Jill Aguilar CMA; 04/30/2017 2:21 PM) General Present- Appetite Loss, Fatigue and Weight Loss. Not Present- Chills, Fever, Night Sweats and Weight Gain. Skin Not Present- Change in Wart/Mole, Dryness, Hives, Jaundice, New Lesions, Non-Healing Wounds, Rash and Ulcer. HEENT Present- Seasonal Allergies and Wears glasses/contact lenses. Not Present- Earache, Hearing Loss, Hoarseness, Nose  Bleed, Oral Ulcers, Ringing in the Ears, Sinus Pain, Sore Throat, Visual Disturbances and Yellow Eyes. Respiratory Not Present- Bloody sputum, Chronic Cough, Difficulty Breathing, Snoring and Wheezing. Breast Not Present- Breast Mass, Breast Pain, Nipple Discharge and Skin Changes. Cardiovascular Present- Chest Pain. Not Present- Difficulty Breathing Lying Down, Leg Cramps, Palpitations, Rapid Heart Rate, Shortness of Breath and Swelling of Extremities. Gastrointestinal Present- Indigestion, Nausea and Vomiting. Not Present- Abdominal Pain, Bloating, Bloody Stool, Change in Bowel Habits, Chronic diarrhea, Constipation, Difficulty Swallowing, Excessive gas, Gets full quickly at meals, Hemorrhoids and Rectal Pain. Female Genitourinary Not Present- Frequency, Nocturia, Painful Urination, Pelvic Pain and Urgency. Musculoskeletal Not Present- Back Pain, Joint Pain, Joint Stiffness, Muscle Pain, Muscle Weakness and Swelling of Extremities. Neurological Present- Decreased Memory, Headaches, Numbness and Weakness. Not Present- Fainting, Seizures, Tingling, Tremor and Trouble walking. Psychiatric Present- Change in Sleep Pattern. Not Present- Anxiety, Bipolar, Depression, Fearful and Frequent crying. Hematology Not Present- Blood Thinners, Easy Bruising, Excessive bleeding, Gland problems, HIV and Persistent Infections.  Vitals (Jill Aguilar CMA; 04/30/2017 2:21 PM) 04/30/2017 2:21 PM Weight: 157.5 lb Height: 66.5in Body Surface Area: 1.82 m Body Mass Index: 25.04 kg/m  Temp.: 98.89F  Pulse: 104 (Regular)  P.OX: 92% (Room air) BP: 122/84 (Sitting, Left Arm, Standard)      Physical Exam Jill Aguilar(Jill Aguilar C. Lyndsay Talamante MD; 04/30/2017 3:17 PM)  General Mental Status-Alert. General Appearance-Not in acute distress, Not Sickly. Orientation-Oriented X3. Hydration-Well hydrated. Voice-Normal.  Integumentary Global Assessment Upon inspection and palpation of skin surfaces of the - Axillae:  non-tender, no inflammation or ulceration, no drainage. and Distribution of scalp and body hair is normal. General Characteristics Temperature - normal warmth is noted.  Head and Neck Head-normocephalic, atraumatic with no lesions or palpable masses. Face Global Assessment - atraumatic, no absence of expression. Neck Global Assessment - no abnormal movements, no bruit auscultated on the right, no bruit auscultated on the left, no decreased range of motion, non-tender. Trachea-midline. Thyroid Gland Characteristics - non-tender.  Eye Eyeball - Left-Extraocular movements intact, No Nystagmus. Eyeball - Right-Extraocular movements intact, No Nystagmus. Cornea - Left-No Hazy. Cornea - Right-No Hazy. Sclera/Conjunctiva - Left-No scleral icterus, No Discharge. Sclera/Conjunctiva - Right-No scleral icterus, No Discharge. Pupil - Left-Direct reaction to light normal. Pupil - Right-Direct reaction to light normal.  ENMT Ears Pinna - Left - no drainage observed, no generalized tenderness observed. Right - no drainage observed, no generalized tenderness observed. Nose and Sinuses External Inspection of the Nose - no destructive lesion observed. Inspection of the nares - Left - quiet respiration. Right - quiet respiration. Mouth and Throat Lips - Upper Lip - no fissures observed, no pallor noted. Lower Lip - no fissures observed, no pallor noted. Nasopharynx - no discharge present. Oral Cavity/Oropharynx - Tongue - no dryness observed. Oral Mucosa - no cyanosis observed. Hypopharynx - no evidence of airway distress observed.  Chest and Lung Exam Inspection Movements - Normal and Symmetrical. Accessory muscles - No use of accessory muscles in breathing. Palpation Palpation of the chest reveals - Non-tender. Auscultation Breath sounds - Normal and Clear.  Cardiovascular Auscultation Rhythm - Regular. Murmurs & Other Heart Sounds - Auscultation of the heart reveals - No  Murmurs and No Systolic Clicks.  Abdomen Inspection Inspection of the abdomen reveals - No Visible peristalsis and No Abnormal pulsations. Umbilicus - No Bleeding, No Urine drainage. Palpation/Percussion Palpation and Percussion of the abdomen reveal - Soft, Non Tender, No Rebound tenderness, No Rigidity (guarding) and No Cutaneous hyperesthesia. Note: Abdomen soft. Uterus infraumbilical.  Not severely distended. No distasis recti. No umbilical or other anterior abdominal wall hernias  Female Genitourinary Sexual Maturity Tanner 5 - Adult hair pattern. Note: No vaginal bleeding nor discharge  Peripheral Vascular Upper Extremity Inspection - Left - No Cyanotic nailbeds, Not Ischemic. Right - No Cyanotic nailbeds, Not Ischemic.  Neurologic Neurologic evaluation reveals -normal attention span and ability to concentrate, able to name objects and repeat phrases. Appropriate fund of knowledge , normal sensation and normal coordination. Mental Status Affect - not angry, not paranoid. Cranial Nerves-Normal Bilaterally. Gait-Normal.  Neuropsychiatric Mental status exam performed with findings of-able to articulate well with normal speech/language, rate, volume and coherence, thought content normal with ability to perform basic computations and apply abstract reasoning and no evidence of hallucinations, delusions, obsessions or homicidal/suicidal ideation.  Musculoskeletal Global Assessment Spine, Ribs and Pelvis - no instability, subluxation or laxity. Right Upper Extremity - no instability, subluxation or laxity.  Lymphatic Head & Neck  General Head & Neck Lymphatics: Bilateral - Description - No Localized lymphadenopathy. Axillary  General Axillary Region: Bilateral - Description - No Localized lymphadenopathy. Femoral & Inguinal  Generalized Femoral & Inguinal Lymphatics: Left - Description - No Localized lymphadenopathy. Right - Description - No Localized  lymphadenopathy.    Assessment & Plan Jill Sportsman MD; 04/30/2017 3:14 PM)  CHRONIC CHOLECYSTITIS WITH CALCULUS (K80.10) Impression: episodes of biliary colic with attacks virtually every day. Been admitted twice. Required IV fluids third time. Unintentional 30 pound weight loss while pregnant.  She's entering the second trimester. It is felt that she will probably not be able to wait another 6 months to be term. With her inability to tolerate by mouth for several days and unintentional weight loss and needing IV fluids, I'm concerned that she cannot wait. She wishes to proceed with surgery.  We'll plan laparoscopic standard four-port cholecystectomy. I would like to avoid a cholangiogram as possible. She does not have increased liver function tests, so I will hold off. Minimize radiation exposure to the fetus.  We will double check with anesthesia and OB Dr Roberts/Vicki LAtham to make sure there is no special monitoring needed perioperatively. I treated to reach her obstetrician today without much luck. I will try again later. Overall they seemed to be on board. She is due to see them tomorrow.It is too soon for fetal viability, but I wish to be thorough.  Current Plans You are being scheduled for surgery- Our schedulers will call you.  You should hear from our office's scheduling department within 5 working days about the location, date, and time of surgery. We try to make accommodations for patient's preferences in scheduling surgery, but sometimes the OR schedule or the surgeon's schedule prevents [place] from making those accommodations.  If you have not heard from our office [phone]) in 5 working days, call the office and ask for your surgeon's nurse.  If you have other questions about your diagnosis, plan, or surgery, call the office and ask for your surgeon's nurse.  Written instructions provided Pt Education - Pamphlet Given - Laparoscopic Gallbladder Surgery: discussed with  patient and provided information. The anatomy & physiology of hepatobiliary & pancreatic function was discussed. The pathophysiology of gallbladder dysfunction was discussed. Natural history risks without surgery was discussed. I feel the risks of no intervention will lead to serious problems that outweigh the operative risks; therefore, I recommended cholecystectomy to remove the pathology. I explained laparoscopic techniques with possible need for an open approach. Probable cholangiogram to evaluate the bilary  tract was explained as well.  Risks such as bleeding, infection, abscess, leak, injury to other organs, need for further treatment, heart attack, death, and other risks were discussed. I noted a good likelihood this will help address the problem. Possibility that this will not correct all abdominal symptoms was explained. Goals of post-operative recovery were discussed as well. We will work to minimize complications. An educational handout further explaining the pathology and treatment options was given as well. Questions were answered. The patient expresses understanding & wishes to proceed with surgery.  Pt Education - [facility] Laparosopic Post Op HCI (Breeley Bischof) Pt Education - [facility] Good Bowel Health (Lacheryl Niesen) Pt Education - Laparoscopic Cholecystectomy: gallbladder  Adin Hector, M.D., F.A.C.S. Gastrointestinal and Minimally Invasive Surgery [facility] [address] [place], [place] [address] [phone] Main / Paging

## 2017-04-30 NOTE — H&P (View-Only) (Signed)
Jill Aguilar Documented: 04/30/2017 2:20 PM Location: [place] Office Patient #: [id number] DOB: 1990/07/07 Single / Language: [place] / Race: Black or African American Female  History of Present Illness Jill Sportsman MD; 04/30/2017 4:07 PM) The patient is a 27 year old female who presents for evaluation of gall stones. Note for "Gall stones": ` ` ` Patient sent for surgical consultation at the request of Dr. Osborn Coho  Chief Complaint: Symptomatic gallstones while pregnant.  The patient is a young female that had nausea and vomiting and came to the hospital in late October. Suspected gallbladder attack. [facility] consulted since she went to the [facility] emergency department. Her tach 1 away. They recommended close outpatient follow-up. She had another attack and went to [facility] long hospital. Suspected another attack. Workup revealed that she was Beta hCG positive. Confirmed to be pregnant. she was admitted given lack of any by mouth tolerance and dehydration. Dr. Abbey Chatters with our group was consulted. He tried to hold off on surgery. She improved & was discharged hospital day number 5. Unfortunately, she had recurring symptoms and was readmitted later in November for a few days to [facility]. Close follow up with obstetrics. She required IV fluids last month. She has been obese with a BMI 30 upon initial presentation, but now she has intentionally lost 30 pounds. She has a few days where she cannot keep much of anything down. She's tried to stick to just fruits and vegetables. Occasional low fat [place]. 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 she recalls being told by her nurse midwife that it is unlikely she will be able to make after her pregnancy without cholecystectomy. There had been a tentative plan to consider cholecystectomy at the second trimester since it is less risky of trimesters to operate. Hence  her coming in today to consider that option.  (Review of systems as stated in this history (HPI) or in the review of systems. Otherwise all other 12 point ROS are negative)   Past Surgical History Jill Aguilar, CMA; 04/30/2017 2:21 PM) Cesarean Section - 1 Oral Surgery Resection of Stomach  Diagnostic Studies History Jill Aguilar, CMA; 04/30/2017 2:21 PM) Colonoscopy never Mammogram >3 years ago Pap Smear 1-5 years ago  Allergies Jill Aguilar, CMA; 04/30/2017 2:23 PM) No Known Drug Allergies [04/30/2017]:  Medication History Jill Aguilar, CMA; 04/30/2017 2:23 PM) Prenatal Vitamins (Oral) Active. Medications Reconciled  Social History Jill Aguilar, CMA; 04/30/2017 2:21 PM) Alcohol use Recently quit alcohol use. Caffeine use Tea. No drug use Tobacco use Never smoker.  Family History Jill Aguilar, CMA; 04/30/2017 2:21 PM) Arthritis Family Members In General, Mother. Bleeding disorder Family Members In General. Diabetes Mellitus Family Members In General. Heart Disease Family Members In General. Hypertension Family Members In General. Migraine Headache Mother. Thyroid problems Family Members In General.  Pregnancy / Birth History Jill Aguilar, CMA; 04/30/2017 2:21 PM) Age at menarche 8 years. Contraceptive History Depo-provera, Intrauterine device, Oral contraceptives. Gravida 2 Irregular periods Maternal age 74-20 Para 1  Other Problems Jill Aguilar, CMA; 04/30/2017 2:21 PM) Bladder Problems Cholelithiasis Heart murmur High blood pressure     Review of Systems (Armen Glenn CMA; 04/30/2017 2:21 PM) General Present- Appetite Loss, Fatigue and Weight Loss. Not Present- Chills, Fever, Night Sweats and Weight Gain. Skin Not Present- Change in Wart/Mole, Dryness, Hives, Jaundice, New Lesions, Non-Healing Wounds, Rash and Ulcer. HEENT Present- Seasonal Allergies and Wears glasses/contact lenses. Not Present- Earache, Hearing Loss, Hoarseness, Nose  Bleed, Oral Ulcers, Ringing in the Ears, Sinus Pain, Sore Throat, Visual Disturbances and Yellow Eyes. Respiratory Not Present- Bloody sputum, Chronic Cough, Difficulty Breathing, Snoring and Wheezing. Breast Not Present- Breast Mass, Breast Pain, Nipple Discharge and Skin Changes. Cardiovascular Present- Chest Pain. Not Present- Difficulty Breathing Lying Down, Leg Cramps, Palpitations, Rapid Heart Rate, Shortness of Breath and Swelling of Extremities. Gastrointestinal Present- Indigestion, Nausea and Vomiting. Not Present- Abdominal Pain, Bloating, Bloody Stool, Change in Bowel Habits, Chronic diarrhea, Constipation, Difficulty Swallowing, Excessive gas, Gets full quickly at meals, Hemorrhoids and Rectal Pain. Female Genitourinary Not Present- Frequency, Nocturia, Painful Urination, Pelvic Pain and Urgency. Musculoskeletal Not Present- Back Pain, Joint Pain, Joint Stiffness, Muscle Pain, Muscle Weakness and Swelling of Extremities. Neurological Present- Decreased Memory, Headaches, Numbness and Weakness. Not Present- Fainting, Seizures, Tingling, Tremor and Trouble walking. Psychiatric Present- Change in Sleep Pattern. Not Present- Anxiety, Bipolar, Depression, Fearful and Frequent crying. Hematology Not Present- Blood Thinners, Easy Bruising, Excessive bleeding, Gland problems, HIV and Persistent Infections.  Vitals (Armen Glenn CMA; 04/30/2017 2:21 PM) 04/30/2017 2:21 PM Weight: 157.5 lb Height: 66.5in Body Surface Area: 1.82 m Body Mass Index: 25.04 kg/m  Temp.: 98.89F  Pulse: 104 (Regular)  P.OX: 92% (Room air) BP: 122/84 (Sitting, Left Arm, Standard)      Physical Exam Jill Aguilar(Harley Fitzwater C. Kinjal Neitzke MD; 04/30/2017 3:17 PM)  General Mental Status-Alert. General Appearance-Not in acute distress, Not Sickly. Orientation-Oriented X3. Hydration-Well hydrated. Voice-Normal.  Integumentary Global Assessment Upon inspection and palpation of skin surfaces of the - Axillae:  non-tender, no inflammation or ulceration, no drainage. and Distribution of scalp and body hair is normal. General Characteristics Temperature - normal warmth is noted.  Head and Neck Head-normocephalic, atraumatic with no lesions or palpable masses. Face Global Assessment - atraumatic, no absence of expression. Neck Global Assessment - no abnormal movements, no bruit auscultated on the right, no bruit auscultated on the left, no decreased range of motion, non-tender. Trachea-midline. Thyroid Gland Characteristics - non-tender.  Eye Eyeball - Left-Extraocular movements intact, No Nystagmus. Eyeball - Right-Extraocular movements intact, No Nystagmus. Cornea - Left-No Hazy. Cornea - Right-No Hazy. Sclera/Conjunctiva - Left-No scleral icterus, No Discharge. Sclera/Conjunctiva - Right-No scleral icterus, No Discharge. Pupil - Left-Direct reaction to light normal. Pupil - Right-Direct reaction to light normal.  ENMT Ears Pinna - Left - no drainage observed, no generalized tenderness observed. Right - no drainage observed, no generalized tenderness observed. Nose and Sinuses External Inspection of the Nose - no destructive lesion observed. Inspection of the nares - Left - quiet respiration. Right - quiet respiration. Mouth and Throat Lips - Upper Lip - no fissures observed, no pallor noted. Lower Lip - no fissures observed, no pallor noted. Nasopharynx - no discharge present. Oral Cavity/Oropharynx - Tongue - no dryness observed. Oral Mucosa - no cyanosis observed. Hypopharynx - no evidence of airway distress observed.  Chest and Lung Exam Inspection Movements - Normal and Symmetrical. Accessory muscles - No use of accessory muscles in breathing. Palpation Palpation of the chest reveals - Non-tender. Auscultation Breath sounds - Normal and Clear.  Cardiovascular Auscultation Rhythm - Regular. Murmurs & Other Heart Sounds - Auscultation of the heart reveals - No  Murmurs and No Systolic Clicks.  Abdomen Inspection Inspection of the abdomen reveals - No Visible peristalsis and No Abnormal pulsations. Umbilicus - No Bleeding, No Urine drainage. Palpation/Percussion Palpation and Percussion of the abdomen reveal - Soft, Non Tender, No Rebound tenderness, No Rigidity (guarding) and No Cutaneous hyperesthesia. Note: Abdomen soft. Uterus infraumbilical.  Not severely distended. No distasis recti. No umbilical or other anterior abdominal wall hernias  Female Genitourinary Sexual Maturity Tanner 5 - Adult hair pattern. Note: No vaginal bleeding nor discharge  Peripheral Vascular Upper Extremity Inspection - Left - No Cyanotic nailbeds, Not Ischemic. Right - No Cyanotic nailbeds, Not Ischemic.  Neurologic Neurologic evaluation reveals -normal attention span and ability to concentrate, able to name objects and repeat phrases. Appropriate fund of knowledge , normal sensation and normal coordination. Mental Status Affect - not angry, not paranoid. Cranial Nerves-Normal Bilaterally. Gait-Normal.  Neuropsychiatric Mental status exam performed with findings of-able to articulate well with normal speech/language, rate, volume and coherence, thought content normal with ability to perform basic computations and apply abstract reasoning and no evidence of hallucinations, delusions, obsessions or homicidal/suicidal ideation.  Musculoskeletal Global Assessment Spine, Ribs and Pelvis - no instability, subluxation or laxity. Right Upper Extremity - no instability, subluxation or laxity.  Lymphatic Head & Neck  General Head & Neck Lymphatics: Bilateral - Description - No Localized lymphadenopathy. Axillary  General Axillary Region: Bilateral - Description - No Localized lymphadenopathy. Femoral & Inguinal  Generalized Femoral & Inguinal Lymphatics: Left - Description - No Localized lymphadenopathy. Right - Description - No Localized  lymphadenopathy.    Assessment & Plan Jill Sportsman MD; 04/30/2017 3:14 PM)  CHRONIC CHOLECYSTITIS WITH CALCULUS (K80.10) Impression: episodes of biliary colic with attacks virtually every day. Been admitted twice. Required IV fluids third time. Unintentional 30 pound weight loss while pregnant.  She's entering the second trimester. It is felt that she will probably not be able to wait another 6 months to be term. With her inability to tolerate by mouth for several days and unintentional weight loss and needing IV fluids, I'm concerned that she cannot wait. She wishes to proceed with surgery.  We'll plan laparoscopic standard four-port cholecystectomy. I would like to avoid a cholangiogram as possible. She does not have increased liver function tests, so I will hold off. Minimize radiation exposure to the fetus.  We will double check with anesthesia and OB Dr Roberts/Vicki LAtham to make sure there is no special monitoring needed perioperatively. I treated to reach her obstetrician today without much luck. I will try again later. Overall they seemed to be on board. She is due to see them tomorrow.It is too soon for fetal viability, but I wish to be thorough.  Current Plans You are being scheduled for surgery- Our schedulers will call you.  You should hear from our office's scheduling department within 5 working days about the location, date, and time of surgery. We try to make accommodations for patient's preferences in scheduling surgery, but sometimes the OR schedule or the surgeon's schedule prevents [place] from making those accommodations.  If you have not heard from our office [phone]) in 5 working days, call the office and ask for your surgeon's nurse.  If you have other questions about your diagnosis, plan, or surgery, call the office and ask for your surgeon's nurse.  Written instructions provided Pt Education - Pamphlet Given - Laparoscopic Gallbladder Surgery: discussed with  patient and provided information. The anatomy & physiology of hepatobiliary & pancreatic function was discussed. The pathophysiology of gallbladder dysfunction was discussed. Natural history risks without surgery was discussed. I feel the risks of no intervention will lead to serious problems that outweigh the operative risks; therefore, I recommended cholecystectomy to remove the pathology. I explained laparoscopic techniques with possible need for an open approach. Probable cholangiogram to evaluate the bilary  tract was explained as well.  Risks such as bleeding, infection, abscess, leak, injury to other organs, need for further treatment, heart attack, death, and other risks were discussed. I noted a good likelihood this will help address the problem. Possibility that this will not correct all abdominal symptoms was explained. Goals of post-operative recovery were discussed as well. We will work to minimize complications. An educational handout further explaining the pathology and treatment options was given as well. Questions were answered. The patient expresses understanding & wishes to proceed with surgery.  Pt Education - [facility] Laparosopic Post Op HCI (Avia Merkley) Pt Education - [facility] Good Bowel Health (Glori Machnik) Pt Education - Laparoscopic Cholecystectomy: gallbladder  Adin Hector, M.D., F.A.C.S. Gastrointestinal and Minimally Invasive Surgery [facility] [address] [place], [place] [address] [phone] Main / Paging

## 2017-05-01 ENCOUNTER — Inpatient Hospital Stay (HOSPITAL_COMMUNITY)
Admission: AD | Admit: 2017-05-01 | Discharge: 2017-05-01 | Disposition: A | Discharge status: Home / Self Care | Payer: Medicaid Other | Source: Ambulatory Visit | Attending: Obstetrics and Gynecology | Admitting: Obstetrics and Gynecology

## 2017-05-01 ENCOUNTER — Encounter (HOSPITAL_COMMUNITY): Payer: Self-pay

## 2017-05-01 DIAGNOSIS — Z3A14 14 weeks gestation of pregnancy: Secondary | ICD-10-CM | POA: Diagnosis not present

## 2017-05-01 DIAGNOSIS — K8010 Calculus of gallbladder with chronic cholecystitis without obstruction: Secondary | ICD-10-CM | POA: Insufficient documentation

## 2017-05-01 DIAGNOSIS — O210 Mild hyperemesis gravidarum: Secondary | ICD-10-CM | POA: Insufficient documentation

## 2017-05-01 DIAGNOSIS — O99612 Diseases of the digestive system complicating pregnancy, second trimester: Secondary | ICD-10-CM | POA: Insufficient documentation

## 2017-05-01 DIAGNOSIS — R1115 Cyclical vomiting syndrome unrelated to migraine: Secondary | ICD-10-CM

## 2017-05-01 LAB — URINALYSIS, ROUTINE W REFLEX MICROSCOPIC
Bilirubin Urine: NEGATIVE
GLUCOSE, UA: NEGATIVE mg/dL
KETONES UR: 80 mg/dL — AB
Leukocytes, UA: NEGATIVE
NITRITE: NEGATIVE
PH: 5 (ref 5.0–8.0)
Protein, ur: 30 mg/dL — AB
Specific Gravity, Urine: 1.031 — ABNORMAL HIGH (ref 1.005–1.030)

## 2017-05-01 MED ORDER — LACTATED RINGERS IV SOLN
Freq: Once | INTRAVENOUS | Status: AC
  Administered 2017-05-01: 15:00:00 via INTRAVENOUS

## 2017-05-01 MED ORDER — M.V.I. ADULT IV INJ
Freq: Once | INTRAVENOUS | Status: AC
  Administered 2017-05-01: 15:00:00 via INTRAVENOUS
  Filled 2017-05-01: qty 10

## 2017-05-01 MED ORDER — ONDANSETRON HCL 4 MG/2ML IJ SOLN
4.0000 mg | Freq: Once | INTRAMUSCULAR | Status: AC
  Administered 2017-05-01: 4 mg via INTRAVENOUS
  Filled 2017-05-01: qty 2

## 2017-05-01 NOTE — Discharge Instructions (Signed)
Cholecystitis  Cholecystitis is inflammation of the gallbladder. It is often called a gallbladder attack. The gallbladder is a pear-shaped organ that lies beneath the liver on the right side of the body. The gallbladder stores bile, which is a fluid that helps the body to digest fats. If bile builds up in your gallbladder, your gallbladder becomes inflamed. This condition may occur suddenly (be acute). Repeat episodes of acute cholecystitis or prolonged episodes may lead to a long-term (chronic) condition. Cholecystitis is serious and it requires treatment.  What are the causes?  The most common cause of this condition is gallstones. Gallstones can block the tube (duct) that carries bile out of your gallbladder. This causes bile to build up. Other causes of this condition include:   Damage to the gallbladder due to a decrease in blood flow.   Infections in the bile ducts.   Scars or kinks in the bile ducts.   Tumors in the liver, pancreas, or gallbladder.    What increases the risk?  This condition is more likely to develop in:   People who have sickle cell disease.   People who take birth control pills or use estrogen.   People who have alcoholic liver disease.   People who have liver cirrhosis.   People who have their nutrition delivered through a vein (parenteral nutrition).   People who do not eat or drink (do fasting) for a long period of time.   People who are obese.   People who have rapid weight loss.   People who are pregnant.   People who have increased triglyceride levels.   People who have pancreatitis.    What are the signs or symptoms?  Symptoms of this condition include:   Abdominal pain, especially in the upper right area of the abdomen.   Abdominal tenderness or bloating.   Nausea.   Vomiting.   Fever.   Chills.   Yellowing of the skin and the whites of the eyes (jaundice).    How is this diagnosed?  This condition is diagnosed with a medical history and physical exam. You may  also have other tests, including:   Imaging tests, such as:  ? An ultrasound of the gallbladder.  ? A CT scan of the abdomen.  ? A gallbladder nuclear scan (HIDA scan). This scan allows your health care provider to see the bile moving from your liver to your gallbladder and to your small intestine.  ? MRI.   Blood tests, such as:  ? A complete blood count, because the white blood cell count may be higher than normal.  ? Liver function tests, because some levels may be higher than normal with certain types of gallstones.    How is this treated?  Treatment may include:   Fasting for a certain amount of time.   IV fluids.   Medicine to treat pain or vomiting.   Antibiotic medicine.   Surgery to remove your gallbladder (cholecystectomy). This may happen immediately or at a later time.    Follow these instructions at home:  Home care will depend on your treatment. In general:   Take over-the-counter and prescription medicines only as told by your health care provider.   If you were prescribed an antibiotic medicine, take it as told by your health care provider. Do not stop taking the antibiotic even if you start to feel better.   Follow instructions from your health care provider about what to eat or drink. When you are allowed to eat,   avoid eating or drinking anything that triggers your symptoms.   Keep all follow-up visits as told by your health care provider. This is important.    Contact a health care provider if:   Your pain is not controlled with medicine.   You have a fever.  Get help right away if:   Your pain moves to another part of your abdomen or to your back.   You continue to have symptoms or you develop new symptoms even with treatment.  This information is not intended to replace advice given to you by your health care provider. Make sure you discuss any questions you have with your health care provider.  Document Released: 04/03/2005 Document Revised: 08/12/2015 Document Reviewed:  07/15/2014  Elsevier Interactive Patient Education  2018 [employer] Inc[employer]

## 2017-05-01 NOTE — MAU Note (Signed)
Pt in office today for routine visit with known gallbladder disease. Scheduled for surgery. Here today for IVF's due to dehydration.

## 2017-05-01 NOTE — MAU Note (Signed)
History   26 yo G2P1001 at 14 weeks was sent to MAU for IV hy41dration and anti-emetic due to persistent issues with N/V and poor nutritional intake related to chronic cholecystitis and cholelithiasis.  She [facility] with a visit there yesterday to begin planning for cholecystectomy in the upcoming weeks.  She has Phenergan and Zofran at home to use, but Phenergan is sedating and Zofran is constipating.  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  She has had limited food and fluid intake since last Thursday, due to intolerance of most intake.  She has lost 14 lbs since pregnancy began, and is losing approx 4-5 lbs per week.  She is not actively vomiting at this time, but has a constant low level of nausea and feels "dry".  Urine is concentrated and BMs are dark and hard.  She is to receive 1 bag MVI infusion with D5LR, IV Zofran x 1 dose, and additional fluid prn based on status and results of UA ketone check.  Patient Active Problem List   Diagnosis Date Noted  . Nausea and vomiting 03/08/2017  . Nausea and vomiting in pregnancy 03/07/2017  . Symptomatic cholelithiasis 02/15/2017  . Calculus of gallbladder with chronic cholecystitis without obstruction   . 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 PTSD (post-traumatic stress disorder) 03/16/2015  . 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 Abdominal Pain  . Emesis   HPI:  See above  OB History    Gravida Para Term Preterm AB Living   2 1 1  0 0 1   SAB TAB Ectopic Multiple Live Births   0 0 0   1      Past Medical History:  Diagnosis Date  . Anxiety   . Depression     Past Surgical History:  Procedure Laterality Date  . CESAREAN SECTION    . WISDOM TOOTH EXTRACTION Bilateral 10/2002    No family history on file.  Social History   Tobacco Use  . Smoking status: Never Smoker  .  Smokeless tobacco: Never Used  Substance Use Topics  . Alcohol use: Yes    Alcohol/week: 3.6 oz    Types: 2 Glasses of wine, 2 Cans of beer, 2 Shots of liquor per week  . Drug use: Yes    Frequency: 1.0 times per week    Types: Marijuana    Allergies: No Known Allergies  Medications Prior to Admission  Medication Sig Dispense Refill Last Dose  . Doxylamine-Pyridoxine (DICLEGIS PO) Take 1 tablet by mouth daily.   03/06/2017 at Unknown time  . famotidine (PEPCID) 20 MG tablet Take 1 tablet (20 mg total) by mouth 2 (two) times daily. 60 tablet 0 03/03/2017  . ondansetron (ZOFRAN ODT) 4 MG disintegrating tablet Take 1 tablet (4 mg total) by mouth every 8 (eight) hours as needed for nausea or vomiting. 36 tablet 2   . promethazine (PHENERGAN) 25 MG tablet Take 1 tablet (25 mg total) by mouth every 6 (six) hours as needed for nausea or vomiting. 36 tablet 2     ROS Physical Exam   Blood pressure 123/72, pulse 71, temperature (!) 97.5 F (36.4 C), temperature source Oral, resp. rate 16, height 5' 6.5" (1.689 m), weight 71.2 kg (157 lb), last menstrual period 01/13/2017, SpO2 100 %.    Physical Exam  Does not appear toxic Chest clear Heart RRR  without murmur Abd gravid, mild chronic RUQ tenderness Uterus FH 14 weeks, NT, FHR 160 Pelvic-deferred Ext WNL  Unable to void at office visit.  ED Course  Assessment: IUP at 14 weeks Chronic cholecystitis and cholelithiasis Weight loss during pregnancy Persistent N/V  Plan: IV hydration with 1 bag MVI--f/u bags based on ketone check Zofran 4 mg IV F/u as scheduled at [facility] in 4 weeks or prn Await scheduled surgery with [facility].   Nigel Bridgeman CNM, MSN 05/01/2017 1:00 PM

## 2017-05-04 ENCOUNTER — Other Ambulatory Visit (HOSPITAL_COMMUNITY): Payer: Self-pay | Admitting: *Deleted

## 2017-05-04 NOTE — Progress Notes (Signed)
EKG 02-11-17 EPIC

## 2017-05-04 NOTE — Patient Instructions (Addendum)
Jill Aguilar  05/04/2017   Your procedure is scheduled on: 05-17-17  Report to [facility] Main  Entrance  Follow signs to Short Stay on first floor at 530 AM  Call this number if you have problems the morning of surgery [phone]   Remember: Do not eat food :After Midnight.  NO SOLID FOOD AFTER MIDNIGHT THE NIGHT PRIOR TO SURGERY. NOTHING BY MOUTH EXCEPT CLEAR LIQUIDS UNTIL 3 HOURS PRIOR TO SCHEULED SURGERY. PLEASE FINISH ENSURE DRINK PER SURGEON ORDER 3 HOURS PRIOR TO SCHEDULED SURGERY TIME WHICH NEEDS TO BE COMPLETED AT 430 AM.  CLEAR LIQUID DIET   Foods Allowed                                                                     Foods Excluded  Coffee and tea, regular and decaf                             liquids that you cannot  Plain Jell-O in any flavor                                             see through such as: Fruit ices (not with fruit pulp)                                     milk, soups, orange juice  Iced Popsicles                                    All solid food Carbonated beverages, regular and diet                                    Cranberry, grape and apple juices Sports drinks like Gatorade Lightly seasoned clear broth or consume(fat free) Sugar, honey syrup  Sample Menu Breakfast                                Lunch                                     Supper Cranberry juice                    Beef broth                            Chicken broth Jell-O                                     Grape juice  Apple juice Coffee or tea                        Jell-O                                      Popsicle                                                Coffee or tea                        Coffee or tea  _____________________________________________________________________    Take these medicines the morning of surgery with A SIP OF WATER: FAMOTIDINE (PEPCID)             You may not have any metal  on your body including hair pins and              piercings  Do not wear jewelry, make-up, lotions, powders or perfumes, deodorant             Do not wear nail polish.  Do not shave  48 hours prior to surgery.              Men may shave face and neck.   Do not bring valuables to the hospital. [facility] IS NOT             RESPONSIBLE   FOR VALUABLES.  Contacts, dentures or bridgework may not be worn into surgery.  Leave suitcase in the car. After surgery it may be brought to your room.     Patients discharged the day of surgery will not be allowed to drive home.  Name and phone number of your driver: mother Jill Aguilar cell [phone]  Special Instructions: N/A              Please read over the following fact sheets you were given: _____________________________________________________________________              CLEAR LIQUID DIET   Foods Allowed                                                                     Foods Excluded  Coffee and tea, regular and decaf                             liquids that you cannot  Plain Jell-O in any flavor                                             see through such as: Fruit ices (not with fruit pulp)                                     milk, soups,  orange juice  Iced Popsicles                                    All solid food Carbonated beverages, regular and diet                                    Cranberry, grape and apple juices Sports drinks like Gatorade Lightly seasoned clear broth or consume(fat free) Sugar, honey syrup  Sample Menu Breakfast                                Lunch                                     Supper Cranberry juice                    Beef broth                            Chicken broth Jell-O                                     Grape juice                           Apple juice Coffee or tea                        Jell-O                                      Popsicle                                                 Coffee or tea                        Coffee or tea  _____________________________________________________________________  [facility] Before surgery, you can play an important role.  Because skin is not sterile, your skin needs to be as free of germs as possible.  You can reduce the number of germs on your skin by washing with CHG (chlorahexidine gluconate) soap before surgery.  CHG is an antiseptic cleaner which kills germs and bonds with the skin to continue killing germs even after washing. Please DO NOT use if you have an allergy to CHG or antibacterial soaps.  If your skin becomes reddened/irritated stop using the CHG and inform your nurse when you arrive at Short Stay. Do not shave (including legs and underarms) for at least 48 hours prior to the first CHG shower.  You may shave your face/neck. Please follow these instructions carefully:  1.  Shower with CHG Soap the night before surgery and the  morning of Surgery.  2.  If you choose to wash your  hair, wash your hair first as usual with your  normal  shampoo.  3.  After you shampoo, rinse your hair and body thoroughly to remove the  shampoo.                           4.  Use CHG as you would any other liquid soap.  You can apply chg directly  to the skin and wash                       Gently with a scrungie or clean washcloth.  5.  Apply the CHG Soap to your body ONLY FROM THE NECK DOWN.   Do not use on face/ open                           Wound or open sores. Avoid contact with eyes, ears mouth and genitals (private parts).                       Wash face,  Genitals (private parts) with your normal soap.             6.  Wash thoroughly, paying special attention to the area where your surgery  will be performed.  7.  Thoroughly rinse your body with warm water from the neck down.  8.  DO NOT shower/wash with your normal soap after using and rinsing off  the CHG Soap.                9.  Pat yourself dry  with a clean towel.            10.  Wear clean pajamas.            11.  Place clean sheets on your bed the night of your first shower and do not  sleep with pets. Day of Surgery : Do not apply any lotions/deodorants the morning of surgery.  Please wear clean clothes to the hospital/surgery center.  FAILURE TO FOLLOW THESE INSTRUCTIONS MAY RESULT IN THE CANCELLATION OF YOUR SURGERY PATIENT SIGNATURE_________________________________  NURSE SIGNATURE__________________________________  ________________________________________________________________________

## 2017-05-04 NOTE — Progress Notes (Signed)
SPOKE WITH KELLY AT [facility] AND MADE AWARE NEED NOTE FROM OB/GYN FOR CLEARANCE FOR SURGERY AND ORDERS IN Epic FOR WHAT TO DO DAY OF SURGERY FOR MONITORING DUE TO PATIENT IN 2ND TRIMESTER PREGNANCY.

## 2017-05-07 ENCOUNTER — Other Ambulatory Visit: Payer: Self-pay

## 2017-05-07 ENCOUNTER — Encounter (HOSPITAL_COMMUNITY)
Admission: RE | Admit: 2017-05-07 | Discharge: 2017-05-07 | Disposition: A | Discharge status: Home / Self Care | Payer: Medicaid Other | Source: Ambulatory Visit | Attending: Surgery | Admitting: Surgery

## 2017-05-07 ENCOUNTER — Encounter (HOSPITAL_COMMUNITY): Payer: Self-pay

## 2017-05-07 DIAGNOSIS — Z01818 Encounter for other preprocedural examination: Secondary | ICD-10-CM | POA: Insufficient documentation

## 2017-05-07 DIAGNOSIS — K811 Chronic cholecystitis: Secondary | ICD-10-CM | POA: Insufficient documentation

## 2017-05-07 HISTORY — DX: Encounter for supervision of normal pregnancy, unspecified, unspecified trimester: Z34.90

## 2017-05-07 HISTORY — DX: Nausea: R11.0

## 2017-05-07 LAB — CBC
HCT: 35.2 % — ABNORMAL LOW (ref 36.0–46.0)
Hemoglobin: 11.9 g/dL — ABNORMAL LOW (ref 12.0–15.0)
MCH: 29.5 pg (ref 26.0–34.0)
MCHC: 33.8 g/dL (ref 30.0–36.0)
MCV: 87.3 fL (ref 78.0–100.0)
PLATELETS: 278 10*3/uL (ref 150–400)
RBC: 4.03 MIL/uL (ref 3.87–5.11)
RDW: 13.1 % (ref 11.5–15.5)
WBC: 9 10*3/uL (ref 4.0–10.5)

## 2017-05-07 NOTE — Progress Notes (Signed)
[facility] AND MADE AWARE PATIENT SECOND TRIMESTER PREGNANCY AND CLEARANCE CALLED FOR TO SURGEON. ANESTHESIA WILL SEE PATIENT DAY OF SURGERY.

## 2017-05-10 NOTE — Progress Notes (Signed)
SPOKE WITH DR Su HiltOBERTS, DR ROBERTS WILL ENTER ORDER IN Epic FOR  CHECKING FETAL HEART TONES BEFORE AND AFTER SURGERY IN EPIC

## 2017-05-10 NOTE — Progress Notes (Deleted)
Spoke with dr Su Hiltroberts and she will place order in epic for checking fetal heart tones before and after surgery on 05-17-17.

## 2017-05-10 NOTE — Progress Notes (Signed)
I spoke with [name], Dr Michaell CowingGross' nurse, regarding [name].  The patient has been symptomatic throughout her pregnancy and is scheduled for cholecystectomy with Dr. Michaell CowingGross next week.  She will be about [redacted]wks pregnant at that time.  There are always risks associated with surgery during pregnancy but given the pt's symptoms and desire to proceed, I see no reason why she can not undergo cholecystectomy as planned.  I am clearing her from an obstetrical stand point. I recommend checking fetal heart tones before and after surgery.  She will be previable at the time of surgery so I do not think it is necessary for any prolonged monitoring.  Please have the pt seen in my office within a week following her surgery.  If you have any questions, please feel free to contact me at [phone].  Thank you

## 2017-05-16 MED ORDER — BUPIVACAINE LIPOSOME 1.3 % IJ SUSP
20.0000 mL | INTRAMUSCULAR | Status: DC
  Filled 2017-05-16: qty 20

## 2017-05-16 NOTE — Progress Notes (Signed)
Spoke with erin rapid response nurse and made awre patient arriving 530 am [facility] short stay and needs fetal heart tone check before and after surgery 05-17-17 per epic order and patient is [redacted] weeks pregnant.

## 2017-05-16 NOTE — Anesthesia Preprocedure Evaluation (Addendum)
Anesthesia Evaluation  Patient identified by MRN, date of birth, ID band Patient awake    Reviewed: Allergy & Precautions, NPO status , Patient's Chart, lab work & pertinent test results  Airway Mallampati: II  TM Distance: >3 FB Neck ROM: Full    Dental no notable dental hx.    Pulmonary neg pulmonary ROS,    Pulmonary exam normal breath sounds clear to auscultation       Cardiovascular negative cardio ROS Normal cardiovascular exam Rhythm:Regular Rate:Normal     Neuro/Psych negative neurological ROS  negative psych ROS   GI/Hepatic negative GI ROS, Neg liver ROS,   Endo/Other  negative endocrine ROS  Renal/GU negative Renal ROS  negative genitourinary   Musculoskeletal negative musculoskeletal ROS (+)   Abdominal   Peds  Hematology negative hematology ROS (+)   Anesthesia Other Findings checking fetal heart tones before and after surgery  Reproductive/Obstetrics (+) Pregnancy [redacted] Wks Pregnant                           Lab Results  Component Value Date   CREATININE 0.83 03/07/2017   BUN 13 03/07/2017   NA 135 03/07/2017   K 3.9 03/07/2017   CL 105 03/07/2017   CO2 17 (L) 03/07/2017    Lab Results  Component Value Date   WBC 9.0 05/07/2017   HGB 11.9 (L) 05/07/2017   HCT 35.2 (L) 05/07/2017   MCV 87.3 05/07/2017   PLT 278 05/07/2017    Anesthesia Physical Anesthesia Plan  ASA: II  Anesthesia Plan: General   Post-op Pain Management:    Induction: Intravenous  PONV Risk Score and Plan: 3 and Treatment may vary due to age or medical condition and Ondansetron  Airway Management Planned: Oral ETT  Additional Equipment:   Intra-op Plan:   Post-operative Plan: Extubation in OR  Informed Consent: I have reviewed the patients History and Physical, chart, labs and discussed the procedure including the risks, benefits and alternatives for the proposed anesthesia with the  patient or authorized representative who has indicated his/her understanding and acceptance.   Dental advisory given  Plan Discussed with: CRNA  Anesthesia Plan Comments:        Anesthesia Quick Evaluation

## 2017-05-16 NOTE — Progress Notes (Deleted)
Spoke with erin rapid response nurse and made aware patient arriving 530 am 05-17-17 [facility] short stay and has order for fetal heart tones check before and after surgery and patient is [redacted] weeks pregnant.

## 2017-05-17 ENCOUNTER — Encounter (HOSPITAL_COMMUNITY)
Admission: RE | Disposition: A | Discharge status: Home / Self Care | Payer: Self-pay | Source: Ambulatory Visit | Attending: Surgery

## 2017-05-17 ENCOUNTER — Ambulatory Visit (HOSPITAL_COMMUNITY)
Admission: RE | Admit: 2017-05-17 | Discharge: 2017-05-17 | Disposition: A | Discharge status: Home / Self Care | Payer: Medicaid Other | Source: Ambulatory Visit | Attending: Surgery | Admitting: Surgery

## 2017-05-17 ENCOUNTER — Other Ambulatory Visit: Payer: Self-pay

## 2017-05-17 ENCOUNTER — Encounter (HOSPITAL_COMMUNITY): Payer: Self-pay | Admitting: *Deleted

## 2017-05-17 ENCOUNTER — Ambulatory Visit (HOSPITAL_COMMUNITY): Payer: Medicaid Other | Admitting: Anesthesiology

## 2017-05-17 DIAGNOSIS — Z832 Family history of diseases of the blood and blood-forming organs and certain disorders involving the immune mechanism: Secondary | ICD-10-CM | POA: Diagnosis not present

## 2017-05-17 DIAGNOSIS — F4310 Post-traumatic stress disorder, unspecified: Secondary | ICD-10-CM | POA: Insufficient documentation

## 2017-05-17 DIAGNOSIS — R011 Cardiac murmur, unspecified: Secondary | ICD-10-CM | POA: Insufficient documentation

## 2017-05-17 DIAGNOSIS — Z8261 Family history of arthritis: Secondary | ICD-10-CM | POA: Insufficient documentation

## 2017-05-17 DIAGNOSIS — Z820 Family history of epilepsy and other diseases of the nervous system: Secondary | ICD-10-CM | POA: Insufficient documentation

## 2017-05-17 DIAGNOSIS — Z3A16 16 weeks gestation of pregnancy: Secondary | ICD-10-CM | POA: Insufficient documentation

## 2017-05-17 DIAGNOSIS — K8010 Calculus of gallbladder with chronic cholecystitis without obstruction: Secondary | ICD-10-CM | POA: Insufficient documentation

## 2017-05-17 DIAGNOSIS — F329 Major depressive disorder, single episode, unspecified: Secondary | ICD-10-CM | POA: Diagnosis not present

## 2017-05-17 DIAGNOSIS — Z833 Family history of diabetes mellitus: Secondary | ICD-10-CM | POA: Insufficient documentation

## 2017-05-17 DIAGNOSIS — F341 Dysthymic disorder: Secondary | ICD-10-CM | POA: Insufficient documentation

## 2017-05-17 DIAGNOSIS — K8020 Calculus of gallbladder without cholecystitis without obstruction: Secondary | ICD-10-CM

## 2017-05-17 DIAGNOSIS — F909 Attention-deficit hyperactivity disorder, unspecified type: Secondary | ICD-10-CM | POA: Diagnosis not present

## 2017-05-17 DIAGNOSIS — E860 Dehydration: Secondary | ICD-10-CM | POA: Diagnosis not present

## 2017-05-17 DIAGNOSIS — K8064 Calculus of gallbladder and bile duct with chronic cholecystitis without obstruction: Secondary | ICD-10-CM | POA: Diagnosis present

## 2017-05-17 DIAGNOSIS — K828 Other specified diseases of gallbladder: Secondary | ICD-10-CM | POA: Diagnosis not present

## 2017-05-17 DIAGNOSIS — O99612 Diseases of the digestive system complicating pregnancy, second trimester: Secondary | ICD-10-CM | POA: Insufficient documentation

## 2017-05-17 DIAGNOSIS — O26612 Liver and biliary tract disorders in pregnancy, second trimester: Secondary | ICD-10-CM

## 2017-05-17 DIAGNOSIS — Z8349 Family history of other endocrine, nutritional and metabolic diseases: Secondary | ICD-10-CM | POA: Diagnosis not present

## 2017-05-17 HISTORY — PX: LAPAROSCOPIC CHOLECYSTECTOMY SINGLE PORT: SHX5891

## 2017-05-17 HISTORY — PX: STERIOD INJECTION: SHX5046

## 2017-05-17 SURGERY — LAPAROSCOPIC CHOLECYSTECTOMY SINGLE SITE
Anesthesia: General

## 2017-05-17 MED ORDER — SCOPOLAMINE 1 MG/3DAYS TD PT72
MEDICATED_PATCH | TRANSDERMAL | Status: AC
  Filled 2017-05-17: qty 1

## 2017-05-17 MED ORDER — TRIAMCINOLONE ACETONIDE 40 MG/ML IJ SUSP
INTRAMUSCULAR | Status: DC | PRN
  Administered 2017-05-17: 40 mg

## 2017-05-17 MED ORDER — SODIUM CHLORIDE 0.9% FLUSH: 3.0000 mL | Freq: Two times a day (BID) | INTRAVENOUS | Status: DC

## 2017-05-17 MED ORDER — BUPIVACAINE-EPINEPHRINE (PF) 0.5% -1:200000 IJ SOLN
INTRAMUSCULAR | Status: AC
  Filled 2017-05-17: qty 30

## 2017-05-17 MED ORDER — PROPOFOL 10 MG/ML IV BOLUS
INTRAVENOUS | Status: DC | PRN
  Administered 2017-05-17: 173 mg via INTRAVENOUS

## 2017-05-17 MED ORDER — ACETAMINOPHEN 500 MG PO TABS
1000.0000 mg | ORAL_TABLET | ORAL | Status: AC
  Administered 2017-05-17: 1000 mg via ORAL
  Filled 2017-05-17: qty 2

## 2017-05-17 MED ORDER — SCOPOLAMINE 1 MG/3DAYS TD PT72
MEDICATED_PATCH | TRANSDERMAL | Status: DC | PRN
  Administered 2017-05-17: 1 via TRANSDERMAL

## 2017-05-17 MED ORDER — OXYCODONE HCL 5 MG PO TABS: 5.0000 mg | ORAL_TABLET | Freq: Four times a day (QID) | ORAL | 0 refills | Status: DC | PRN

## 2017-05-17 MED ORDER — PROPOFOL 10 MG/ML IV BOLUS
INTRAVENOUS | Status: AC
  Filled 2017-05-17: qty 20

## 2017-05-17 MED ORDER — ROCURONIUM BROMIDE 10 MG/ML (PF) SYRINGE
PREFILLED_SYRINGE | INTRAVENOUS | Status: AC
  Filled 2017-05-17: qty 5

## 2017-05-17 MED ORDER — PHENYLEPHRINE 40 MCG/ML (10ML) SYRINGE FOR IV PUSH (FOR BLOOD PRESSURE SUPPORT)
PREFILLED_SYRINGE | INTRAVENOUS | Status: DC | PRN
  Administered 2017-05-17 (×2): 80 ug via INTRAVENOUS

## 2017-05-17 MED ORDER — ACETAMINOPHEN 325 MG PO TABS: 650.0000 mg | ORAL_TABLET | ORAL | Status: DC | PRN

## 2017-05-17 MED ORDER — SUCCINYLCHOLINE CHLORIDE 200 MG/10ML IV SOSY
PREFILLED_SYRINGE | INTRAVENOUS | Status: AC
  Filled 2017-05-17: qty 10

## 2017-05-17 MED ORDER — FENTANYL CITRATE (PF) 100 MCG/2ML IJ SOLN: 25.0000 ug | INTRAMUSCULAR | Status: DC | PRN

## 2017-05-17 MED ORDER — TRIAMCINOLONE ACETONIDE 40 MG/ML IJ SUSP
40.0000 mg | Freq: Once | INTRAMUSCULAR | Status: DC
  Filled 2017-05-17: qty 1

## 2017-05-17 MED ORDER — OXYCODONE HCL 5 MG PO TABS
ORAL_TABLET | ORAL | Status: AC
  Filled 2017-05-17: qty 2

## 2017-05-17 MED ORDER — DEXAMETHASONE SODIUM PHOSPHATE 10 MG/ML IJ SOLN
INTRAMUSCULAR | Status: AC
  Filled 2017-05-17: qty 1

## 2017-05-17 MED ORDER — DIPHENHYDRAMINE HCL 25 MG PO CAPS
ORAL_CAPSULE | ORAL | Status: AC
  Filled 2017-05-17: qty 1

## 2017-05-17 MED ORDER — BUPIVACAINE-EPINEPHRINE 0.5% -1:200000 IJ SOLN
INTRAMUSCULAR | Status: DC | PRN
  Administered 2017-05-17: 30 mL

## 2017-05-17 MED ORDER — ACETAMINOPHEN 10 MG/ML IV SOLN: 1000.0000 mg | Freq: Once | INTRAVENOUS | Status: DC | PRN

## 2017-05-17 MED ORDER — ONDANSETRON HCL 4 MG/2ML IJ SOLN
INTRAMUSCULAR | Status: DC | PRN
  Administered 2017-05-17: 4 mg via INTRAVENOUS

## 2017-05-17 MED ORDER — PROMETHAZINE HCL 25 MG/ML IJ SOLN: 6.2500 mg | INTRAMUSCULAR | Status: DC | PRN

## 2017-05-17 MED ORDER — EPHEDRINE 5 MG/ML INJ
INTRAVENOUS | Status: AC
  Filled 2017-05-17: qty 10

## 2017-05-17 MED ORDER — HYDROMORPHONE HCL 1 MG/ML IJ SOLN: 0.2500 mg | INTRAMUSCULAR | Status: DC | PRN

## 2017-05-17 MED ORDER — HYDROCODONE-ACETAMINOPHEN 7.5-325 MG PO TABS
1.0000 | ORAL_TABLET | Freq: Once | ORAL | Status: DC | PRN
Start: 2017-05-17 — End: 2017-05-17

## 2017-05-17 MED ORDER — HYDROMORPHONE HCL 1 MG/ML IJ SOLN
INTRAMUSCULAR | Status: AC
  Filled 2017-05-17: qty 1

## 2017-05-17 MED ORDER — LACTATED RINGERS IV SOLN
INTRAVENOUS | Status: DC | PRN
  Administered 2017-05-17 (×2): via INTRAVENOUS

## 2017-05-17 MED ORDER — DIPHENHYDRAMINE HCL 25 MG PO CAPS
25.0000 mg | ORAL_CAPSULE | Freq: Once | ORAL | Status: AC
  Administered 2017-05-17: 25 mg via ORAL

## 2017-05-17 MED ORDER — SODIUM CHLORIDE 0.9 % IV SOLN: 250.0000 mL | INTRAVENOUS | Status: DC | PRN

## 2017-05-17 MED ORDER — IOPAMIDOL (ISOVUE-300) INJECTION 61%
INTRAVENOUS | Status: AC
  Filled 2017-05-17: qty 50

## 2017-05-17 MED ORDER — STERILE WATER FOR IRRIGATION IR SOLN
Status: DC | PRN
  Administered 2017-05-17: 1000 mL

## 2017-05-17 MED ORDER — MEPERIDINE HCL 50 MG/ML IJ SOLN: 6.2500 mg | INTRAMUSCULAR | Status: DC | PRN

## 2017-05-17 MED ORDER — ACETAMINOPHEN 650 MG RE SUPP
650.0000 mg | RECTAL | Status: DC | PRN
  Filled 2017-05-17: qty 1

## 2017-05-17 MED ORDER — NEOSTIGMINE METHYLSULFATE 5 MG/5ML IV SOSY
PREFILLED_SYRINGE | INTRAVENOUS | Status: AC
  Filled 2017-05-17: qty 5

## 2017-05-17 MED ORDER — ONDANSETRON HCL 4 MG/2ML IJ SOLN: 4.0000 mg | Freq: Once | INTRAMUSCULAR | Status: DC

## 2017-05-17 MED ORDER — LIDOCAINE 2% (20 MG/ML) 5 ML SYRINGE
INTRAMUSCULAR | Status: AC
  Filled 2017-05-17: qty 5

## 2017-05-17 MED ORDER — PHENYLEPHRINE 40 MCG/ML (10ML) SYRINGE FOR IV PUSH (FOR BLOOD PRESSURE SUPPORT)
PREFILLED_SYRINGE | INTRAVENOUS | Status: AC
  Filled 2017-05-17: qty 10

## 2017-05-17 MED ORDER — LIDOCAINE 2% (20 MG/ML) 5 ML SYRINGE
INTRAMUSCULAR | Status: DC | PRN
  Administered 2017-05-17: 80 mg via INTRAVENOUS

## 2017-05-17 MED ORDER — LACTATED RINGERS IR SOLN
Status: DC | PRN
  Administered 2017-05-17: 1000 mL

## 2017-05-17 MED ORDER — ROCURONIUM BROMIDE 10 MG/ML (PF) SYRINGE
PREFILLED_SYRINGE | INTRAVENOUS | Status: DC | PRN
  Administered 2017-05-17: 10 mg via INTRAVENOUS
  Administered 2017-05-17: 40 mg via INTRAVENOUS

## 2017-05-17 MED ORDER — DEXAMETHASONE SODIUM PHOSPHATE 10 MG/ML IJ SOLN
INTRAMUSCULAR | Status: DC | PRN
  Administered 2017-05-17: 10 mg via INTRAVENOUS

## 2017-05-17 MED ORDER — FENTANYL CITRATE (PF) 250 MCG/5ML IJ SOLN
INTRAMUSCULAR | Status: AC
  Filled 2017-05-17: qty 5

## 2017-05-17 MED ORDER — CHLORHEXIDINE GLUCONATE CLOTH 2 % EX PADS: 6.0000 | MEDICATED_PAD | Freq: Once | CUTANEOUS | Status: DC

## 2017-05-17 MED ORDER — HYDROMORPHONE HCL 1 MG/ML IJ SOLN
0.2500 mg | INTRAMUSCULAR | Status: DC | PRN
  Administered 2017-05-17 (×4): 0.5 mg via INTRAVENOUS

## 2017-05-17 MED ORDER — GLYCOPYRROLATE 0.2 MG/ML IV SOSY
PREFILLED_SYRINGE | INTRAVENOUS | Status: AC
  Filled 2017-05-17: qty 5

## 2017-05-17 MED ORDER — NEOSTIGMINE METHYLSULFATE 5 MG/5ML IV SOSY
PREFILLED_SYRINGE | INTRAVENOUS | Status: DC | PRN
  Administered 2017-05-17: 3 mg via INTRAVENOUS

## 2017-05-17 MED ORDER — HYDROCODONE-ACETAMINOPHEN 7.5-325 MG PO TABS: 1.0000 | ORAL_TABLET | Freq: Once | ORAL | Status: DC | PRN

## 2017-05-17 MED ORDER — ONDANSETRON HCL 4 MG/2ML IJ SOLN
INTRAMUSCULAR | Status: AC
  Filled 2017-05-17: qty 2

## 2017-05-17 MED ORDER — GLYCOPYRROLATE 0.2 MG/ML IV SOSY
PREFILLED_SYRINGE | INTRAVENOUS | Status: DC | PRN
  Administered 2017-05-17: 0.6 mg via INTRAVENOUS

## 2017-05-17 MED ORDER — EPHEDRINE SULFATE-NACL 50-0.9 MG/10ML-% IV SOSY
PREFILLED_SYRINGE | INTRAVENOUS | Status: DC | PRN
  Administered 2017-05-17: 15 mg via INTRAVENOUS

## 2017-05-17 MED ORDER — FENTANYL CITRATE (PF) 250 MCG/5ML IJ SOLN
INTRAMUSCULAR | Status: DC | PRN
  Administered 2017-05-17 (×2): 100 ug via INTRAVENOUS
  Administered 2017-05-17: 50 ug via INTRAVENOUS

## 2017-05-17 MED ORDER — 0.9 % SODIUM CHLORIDE (POUR BTL) OPTIME
TOPICAL | Status: DC | PRN
  Administered 2017-05-17: 1000 mL

## 2017-05-17 MED ORDER — SODIUM CHLORIDE 0.9% FLUSH: 3.0000 mL | INTRAVENOUS | Status: DC | PRN

## 2017-05-17 MED ORDER — OXYCODONE HCL 5 MG PO TABS
5.0000 mg | ORAL_TABLET | ORAL | Status: DC | PRN
  Administered 2017-05-17: 10 mg via ORAL

## 2017-05-17 SURGICAL SUPPLY — 40 items
APPLIER CLIP 5 13 M/L LIGAMAX5 (MISCELLANEOUS) ×4
CABLE HIGH FREQUENCY MONO STRZ (ELECTRODE) ×4 IMPLANT
CHLORAPREP W/TINT 26ML (MISCELLANEOUS) ×4 IMPLANT
CLIP APPLIE 5 13 M/L LIGAMAX5 (MISCELLANEOUS) ×2 IMPLANT
COVER MAYO STAND STRL (DRAPES) ×4 IMPLANT
COVER SURGICAL LIGHT HANDLE (MISCELLANEOUS) ×4 IMPLANT
DECANTER SPIKE VIAL GLASS SM (MISCELLANEOUS) ×4 IMPLANT
DERMABOND ADVANCED (GAUZE/BANDAGES/DRESSINGS) ×2
DERMABOND ADVANCED .7 DNX12 (GAUZE/BANDAGES/DRESSINGS) ×2 IMPLANT
DRAIN CHANNEL 19F RND (DRAIN) IMPLANT
DRAPE C-ARM 42X120 X-RAY (DRAPES) IMPLANT
DRAPE WARM FLUID 44X44 (DRAPE) ×4 IMPLANT
DRSG TEGADERM 4X4.75 (GAUZE/BANDAGES/DRESSINGS) ×4 IMPLANT
ELECT REM PT RETURN 15FT ADLT (MISCELLANEOUS) ×4 IMPLANT
ENDOLOOP SUT PDS II  0 18 (SUTURE) ×2
ENDOLOOP SUT PDS II 0 18 (SUTURE) ×2 IMPLANT
EVACUATOR SILICONE 100CC (DRAIN) IMPLANT
GAUZE SPONGE 2X2 8PLY STRL LF (GAUZE/BANDAGES/DRESSINGS) ×2 IMPLANT
GLOVE ECLIPSE 8.0 STRL XLNG CF (GLOVE) ×4 IMPLANT
GLOVE INDICATOR 8.0 STRL GRN (GLOVE) ×4 IMPLANT
GOWN STRL REUS W/TWL XL LVL3 (GOWN DISPOSABLE) ×8 IMPLANT
IRRIG SUCT STRYKERFLOW 2 WTIP (MISCELLANEOUS) ×4
IRRIGATION SUCT STRKRFLW 2 WTP (MISCELLANEOUS) ×2 IMPLANT
KIT BASIN OR (CUSTOM PROCEDURE TRAY) ×4 IMPLANT
PAD POSITIONING PINK XL (MISCELLANEOUS) ×4 IMPLANT
POSITIONER SURGICAL ARM (MISCELLANEOUS) IMPLANT
POUCH SPECIMEN RETRIEVAL 10MM (ENDOMECHANICALS) IMPLANT
SCISSORS METZENBAUM CVD 33 (INSTRUMENTS) ×4 IMPLANT
SET CHOLANGIOGRAPH MIX (MISCELLANEOUS) IMPLANT
SHEARS HARMONIC ACE PLUS 36CM (ENDOMECHANICALS) ×4 IMPLANT
SPONGE GAUZE 2X2 STER 10/PKG (GAUZE/BANDAGES/DRESSINGS) ×2
SUT MNCRL AB 4-0 PS2 18 (SUTURE) ×4 IMPLANT
SUT PDS AB 1 CT1 27 (SUTURE) ×16 IMPLANT
SYR 20CC LL (SYRINGE) ×4 IMPLANT
TOWEL OR 17X26 10 PK STRL BLUE (TOWEL DISPOSABLE) ×4 IMPLANT
TOWEL OR NON WOVEN STRL DISP B (DISPOSABLE) ×4 IMPLANT
TRAY LAPAROSCOPIC (CUSTOM PROCEDURE TRAY) ×4 IMPLANT
TROCAR BLADELESS OPT 5 100 (ENDOMECHANICALS) ×4 IMPLANT
TROCAR BLADELESS OPT 5 150 (ENDOMECHANICALS) ×4 IMPLANT
TUBING INSUF HEATED (TUBING) ×4 IMPLANT

## 2017-05-17 NOTE — Interval H&P Note (Signed)
History and Physical Interval Note:  05/17/2017 7:15 AM  Jill Aguilar  has presented today for surgery, with the diagnosis of Chronic cholecystitis  The various methods of treatment have been discussed with the patient and family. After consideration of risks, benefits and other options for treatment, the patient has consented to  Procedure(s): LAPAROSCOPIC CHOLECYSTECTOMY SINGLE SITE 4 PORT (N/A) as a surgical intervention .  The patient's history has been reviewed, patient examined, no change in status, stable for surgery.  I have reviewed the patient's chart and labs.  Questions were answered to the patient's satisfaction.    I have re-reviewed the the patient's records, history, medications, and allergies.  I have re-examined the patient.  I again discussed intraoperative plans and goals of post-operative recovery.  The patient agrees to proceed.  Jill Aguilar  12-Jun-1990 [id number]  Patient Care Team: Patient, No Pcp Per as PCP - General (General Practice) Osborn Cohooberts, Angela, MD as Consulting Physician (Obstetrics and Gynecology) Karie SodaGross, Harmonee Tozer, MD as Consulting Physician (General Surgery) Nigel BridgemanLatham, Vicki, CNM as Midwife (Obstetrics and Gynecology)  Patient Active Problem List   Diagnosis Date Noted  . Nausea and vomiting 03/08/2017  . Nausea and vomiting in pregnancy 03/07/2017  . Symptomatic cholelithiasis 02/15/2017  . Calculus of gallbladder with chronic cholecystitis without obstruction   . 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 PTSD (post-traumatic stress disorder) 03/16/2015  . 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 Anxiety   . Depression   . Nausea   . Pregnancy 15 weeks as of 05-07-17    Past Surgical History:  Procedure Laterality Date  . CESAREAN SECTION    . WISDOM TOOTH EXTRACTION Bilateral 10/2002    Social History   Socioeconomic History  . Marital status:  Married    Spouse name: Not on file  . Number of children: Not on file  . Years of education: Not on file  . Highest education level: Not on file  Social Needs  . Financial resource strain: Not on file  . Food insecurity - worry: Not on file  . Food insecurity - inability: Not on file  . Transportation needs - medical: Not on file  . Transportation needs - non-medical: Not on file  Occupational History  . Not on file  Tobacco Use  . Smoking status: Never Smoker  . Smokeless tobacco: Never Used  Substance and Sexual Activity  . Alcohol use: No    Alcohol/week: 0.0 oz    Frequency: Never    Comment: none since pregnancy  . Drug use: Yes    Frequency: 1.0 times per week    Types: Marijuana    Comment: none since pregnancy  . Sexual activity: Yes    Birth control/protection: None  Other Topics Concern  . Not on file  Social History Narrative   ** Merged History Encounter **        History reviewed. No pertinent family history.  Medications Prior to Admission  Medication Sig Dispense Refill Last Dose  . famotidine (PEPCID) 20 MG tablet Take 1 tablet (20 mg total) by mouth 2 (two) times daily. 60 tablet 0 05/16/2017 at Unknown time  . Prenatal Vit-Fe Fumarate-FA (PRENATAL MULTIVITAMIN) TABS tablet Take 1 tablet by mouth daily at 12 noon.   Past Week at Unknown time    Current Facility-Administered Medications  Medication Dose Route Frequency Provider Last Rate Last Dose  . bupivacaine liposome (EXPAREL) 1.3 % injection 266  mg  20 mL Infiltration On Call to OR Karie Soda, MD      . Chlorhexidine Gluconate Cloth 2 % PADS 6 each  6 each Topical Once Karie Soda, MD       And  . Chlorhexidine Gluconate Cloth 2 % PADS 6 each  6 each Topical Once Karie Soda, MD       Facility-Administered Medications Ordered in Other Encounters  Medication Dose Route Frequency Provider Last Rate Last Dose  . lactated ringers infusion    Continuous PRN Jhonnie Garner, CRNA      .  scopolamine (TRANSDERM-SCOP) 1 MG/3DAYS    Anesthesia Intra-op Jhonnie Garner, CRNA   1 patch at 05/17/17 0645     No Known Allergies  BP 111/66   Pulse 86   Temp 97.9 F (36.6 C) (Oral)   Resp 18   Ht 5' 5.5" (1.664 m)   Wt 71.2 kg (157 lb)   LMP 01/13/2017 (Exact Date)   SpO2 100%   BMI 25.73 kg/m   Labs: No results found for this or any previous visit (from the past 48 hour(s)).  Imaging / Studies: No results found.   [name], M.D., F.A.C.S. Gastrointestinal and Minimally Invasive Surgery [facility] [address], [address] [place], [place] [address] [phone] Main / Paging  05/17/2017 7:15 AM    [name]

## 2017-05-17 NOTE — Anesthesia Postprocedure Evaluation (Signed)
Anesthesia Post Note  Patient: Ermalinda MemosOlivia Cheri' Lambert-Tucker  Procedure(s) Performed: LAPAROSCOPIC CHOLECYSTECTOMY SINGLE SITE (N/A ) STEROID INJECTION OF INCISION WITH KENALOG 40MG      Patient location during evaluation: PACU Anesthesia Type: General Level of consciousness: awake and alert Pain management: pain level controlled Vital Signs Assessment: post-procedure vital signs reviewed and stable Respiratory status: spontaneous breathing, nonlabored ventilation, respiratory function stable and patient connected to nasal cannula oxygen Cardiovascular status: blood pressure returned to baseline and stable Postop Assessment: no apparent nausea or vomiting Anesthetic complications: no Comments: Post Op FHT 120s     Last Vitals:  Vitals:   05/17/17 1000 05/17/17 1015  BP: 114/73 115/75  Pulse: 65 98  Resp: 19 20  Temp:  36.4 C  SpO2: 100% 100%    Last Pain:  Vitals:   05/17/17 1015  TempSrc:   PainSc: 4                  Trevor IhaStephen A Hameed Kolar

## 2017-05-17 NOTE — Discharge Instructions (Addendum)
LAPAROSCOPIC SURGERY: POST OP INSTRUCTIONS  ######################################################################  EAT Gradually transition to a high fiber diet with a fiber supplement over the next few weeks after discharge.  Start with a pureed / full liquid diet (see below)  WALK Walk an hour a day.  Control your pain to do that.    CONTROL PAIN Control pain so that you can walk, sleep, tolerate sneezing/coughing, go up/down stairs.  HAVE A BOWEL MOVEMENT DAILY Keep your bowels regular to avoid problems.  OK to try a laxative to override constipation.  OK to use an antidairrheal to slow down diarrhea.  Call if not better after 2 tries  CALL IF YOU HAVE PROBLEMS/CONCERNS Call if you are still struggling despite following these instructions. Call if you have concerns not answered by these instructions  ######################################################################    1. DIET: Follow a light bland diet the first 24 hours after arrival home, such as soup, liquids, crackers, etc.  Be sure to include lots of fluids daily.  Avoid fast food or heavy meals as your are more likely to get nauseated.  Eat a low fat the next few days after surgery.   2. Take your usually prescribed home medications unless otherwise directed. 3. PAIN CONTROL: a. Pain is best controlled by a usual combination of three different methods TOGETHER: i. Ice/Heat ii. Over the counter pain medication iii. Prescription pain medication b. Most patients will experience some swelling and bruising around the incisions.  Ice packs or heating pads (30-60 minutes up to 6 times a day) will help. Use ice for the first few days to help decrease swelling and bruising, then switch to heat to help relax tight/sore spots and speed recovery.  Some people prefer to use ice alone, heat alone, alternating between ice & heat.  Experiment to what works for you.  Swelling and bruising can take several weeks to resolve.   i. It is  helpful to take an over-the-counter pain medication regularly for the first few weeks.  Choose Acetaminophen (Tylenol, etc) 500-650mg  four times a day (every meal & bedtime) c. A  prescription for pain medication (such as oxycodone, hydrocodone, etc) should be given to you upon discharge.  Take your pain medication as prescribed.  i. If you are having problems/concerns with the prescription medicine (does not control pain, nausea, vomiting, rash, itching, etc), please call us [phone] to see if we need to switch you to a different pain medicine that will work better for you and/or control your side effect better. ii. If you need a refill on your pain medication, please contact your pharmacy.  They will contact our office to request authorization. Prescriptions will not be filled after 5 pm or on week-ends. 4. Avoid getting constipated.  Between the surgery and the pain medications, it is common to experience some constipation.  Increasing fluid intake and taking a fiber supplement (such as Metamucil, Citrucel, FiberCon, MiraLax, etc) 1-2 times a day regularly will usually help prevent this problem from occurring.  A mild laxative (prune juice, Milk of Magnesia, MiraLax, etc) should be taken according to package directions if there are no bowel movements after 48 hours.   5. Watch out for diarrhea.  If you have many loose bowel movements, simplify your diet to bland foods & liquids for a few days.  Stop any stool softeners and decrease your fiber supplement.  Switching to mild anti-diarrheal medications (Kayopectate, Pepto Bismol) can help.  If this worsens or does not improve, please call us. 6.  Wash / shower every day.  You may shower over the dressings as they are waterproof.  Continue to shower over incision(s) after the dressing is off. 7. Remove your waterproof bandages 5 days after surgery.  You may leave the incision open to air.  You may replace a dressing/Band-Aid to cover the incision for  comfort if you wish.  8. ACTIVITIES as tolerated:   a. You may resume regular (light) daily activities beginning the next day--such as daily self-care, walking, climbing stairs--gradually increasing activities as tolerated.  If you can walk 30 minutes without difficulty, it is safe to try more intense activity such as jogging, treadmill, bicycling, low-impact aerobics, swimming, etc. b. Save the most intensive and strenuous activity for last such as sit-ups, heavy lifting, contact sports, etc  Refrain from any heavy lifting or straining until you are off narcotics for pain control.   c. DO NOT PUSH THROUGH PAIN.  Let pain be your guide: If it hurts to do something, don't do it.  Pain is your body warning you to avoid that activity for another week until the pain goes down. d. You may drive when you are no longer taking prescription pain medication, you can comfortably wear a seatbelt, and you can safely maneuver your car and apply brakes. e. Bonita Quin may have sexual intercourse when it is comfortable.  9. FOLLOW UP in our office a. Please call [facility] at [phone] to set up an appointment to see your surgeon in the office for a follow-up appointment approximately 2-3 weeks after your surgery. b. Make sure that you call for this appointment the day you arrive home to insure a convenient appointment time. 10. IF YOU HAVE DISABILITY OR FAMILY LEAVE FORMS, BRING THEM TO THE OFFICE FOR PROCESSING.  DO NOT GIVE THEM TO YOUR DOCTOR.   WHEN TO CALL us [phone]: 1. Poor pain control 2. Reactions / problems with new medications (rash/itching, nausea, etc)  3. Fever over 101.5 F (38.5 C) 4. Inability to urinate 5. Nausea and/or vomiting 6. Worsening swelling or bruising 7. Continued bleeding from incision. 8. Increased pain, redness, or drainage from the incision   The clinic staff is available to answer your questions during regular business hours (8:30am-5pm).  Please dont hesitate to call and ask  to speak to one of our nurses for clinical concerns.   If you have a medical emergency, go to the nearest emergency room or call 911.  A surgeon from [facility] is always on call at the [facility], [place] [address], [place], [place]  [address] ? MAIN: [phone] ? TOLL FREE: [phone] ?  FAX [phone] [email or web address]   Cholecystitis Cholecystitis is inflammation of the gallbladder. It is often called a gallbladder attack. The gallbladder is a pear-shaped organ that lies beneath the liver on the right side of the body. The gallbladder stores bile, which is a fluid that helps the body to digest fats. If bile builds up in your gallbladder, your gallbladder becomes inflamed. This condition may occur suddenly (be acute). Repeat episodes of acute cholecystitis or prolonged episodes may lead to a long-term (chronic) condition. Cholecystitis is serious and it requires treatment. What are the causes? The most common cause of this condition is gallstones. Gallstones can block the tube (duct) that carries bile out of your gallbladder. This causes bile to build up. Other causes of this condition include:  Damage to the gallbladder due to a decrease  in blood flow.  Infections in the bile ducts.  Scars or kinks in the bile ducts.  Tumors in the liver, pancreas, or gallbladder.  What increases the risk? This condition is more likely to develop in:  People who have sickle cell disease.  People who take birth control pills or use estrogen.  People who have alcoholic liver disease.  People who have liver cirrhosis.  People who have their nutrition delivered through a vein (parenteral nutrition).  People who do not eat or drink (do fasting) for a long period of time.  People who are obese.  People who have rapid weight loss.  People who are pregnant.  People who have increased triglyceride  levels.  People who have pancreatitis.  What are the signs or symptoms? Symptoms of this condition include:  Abdominal pain, especially in the upper right area of the abdomen.  Abdominal tenderness or bloating.  Nausea.  Vomiting.  Fever.  Chills.  Yellowing of the skin and the whites of the eyes (jaundice).  How is this diagnosed? This condition is diagnosed with a medical history and physical exam. You may also have other tests, including:  Imaging tests, such as: ? An ultrasound of the gallbladder. ? A CT scan of the abdomen. ? A gallbladder nuclear scan (HIDA scan). This scan allows your health care provider to see the bile moving from your liver to your gallbladder and to your small intestine. ? MRI.  Blood tests, such as: ? A complete blood count, because the white blood cell count may be higher than normal. ? Liver function tests, because some levels may be higher than normal with certain types of gallstones.  How is this treated? Treatment may include:  Fasting for a certain amount of time.  IV fluids.  Medicine to treat pain or vomiting.  Antibiotic medicine.  Surgery to remove your gallbladder (cholecystectomy). This may happen immediately or at a later time.  Follow these instructions at home: Home care will depend on your treatment. In general:  Take over-the-counter and prescription medicines only as told by your health care provider.  If you were prescribed an antibiotic medicine, take it as told by your health care provider. Do not stop taking the antibiotic even if you start to feel better.  Follow instructions from your health care provider about what to eat or drink. When you are allowed to eat, avoid eating or drinking anything that triggers your symptoms.  Keep all follow-up visits as told by your health care provider. This is important.  Contact a health care provider if:  Your pain is not controlled with medicine.  You have a  fever. Get help right away if:  Your pain moves to another part of your abdomen or to your back.  You continue to have symptoms or you develop new symptoms even with treatment. This information is not intended to replace advice given to you by your health care provider. Make sure you discuss any questions you have with your health care provider. Document Released: 04/03/2005 Document Revised: 08/12/2015 Document Reviewed: 07/15/2014 Elsevier Interactive Patient Education  2018 [employer].   The laboratory will allow you to obtain your gallbladder as requested.  The lab has requested that they keep your gallbladder for 2 weeks.  After two weeks you will be allowed to call the lab and arrange a time to come back to the hospital and pick up your specimen from the lab.  This specimen will not be available to you until  after February 15.  Please call [phone] to arrange a time for pick up.  Please bring your driver's license with you on the day you pick up the specimen.  There will be some paperwork that you need to fill out with the lab when you arrive.

## 2017-05-17 NOTE — Op Note (Signed)
05/17/2017  PATIENT:  Jill Aguilar  27 y.o. female  Patient Care Team: Patient, No Pcp Per as PCP - General (General Practice) Osborn Cohooberts, Angela, MD as Consulting Physician (Obstetrics and Gynecology) Karie SodaGross, Kirtan Sada, MD as Consulting Physician (General Surgery) Nigel BridgemanLatham, Vicki, CNM as Midwife (Obstetrics and Gynecology)  PRE-OPERATIVE DIAGNOSIS:    Chronic Calculus cholecystitis  POST-OPERATIVE DIAGNOSIS:   Chronic Calculus cholecystitis  Liver: normal  PROCEDURE:  SINGLE SITE Laparoscopic cholecystectomy  SURGEON:  Ardeth SportsmanSteven C. Tamaya Pun, MD, FACS.  ASSISTANT: none   ANESTHESIA:    General with endotracheal intubation Local anesthetic as a field block  EBL:  (See Anesthesia Intraoperative Record) Total I/O In: 1000 [I.V.:1000] Out: 20 [Blood:20]  Delay start of Pharmacological VTE agent (>24hrs) due to surgical blood loss or risk of bleeding:  no  DRAINS: None   SPECIMEN: Gallbladder    DISPOSITION OF SPECIMEN:  PATHOLOGY  COUNTS:  YES  PLAN OF CARE: Discharge to home after PACU  PATIENT DISPOSITION:  PACU - hemodynamically stable.  INDICATION: Pleasant woman struggling with nausea and vomiting and found to have symptomatic gallstones.  Pregnant.  Been struggling with her first trimester with 3 admissions twice for dehydration as well as needing office fluids.  Unintentional weight loss.  Felt by her obstetrician and surgery that she most likely will not be able to get to terms safely with her severe nausea vomiting and chronic cholecystitis.  Discussed with obstetrics and surgery.  Recommendation made to proceed with cholecystectomy during pregnancy.  Second trimester usually the safest time.  Patient, fianc, mother agree.  The anatomy & physiology of hepatobiliary & pancreatic function was discussed.  The pathophysiology of gallbladder dysfunction was discussed.  Natural history risks without surgery was discussed.   I feel the risks of no intervention will  lead to serious problems that outweigh the operative risks; therefore, I recommended cholecystectomy to remove the pathology.  I explained laparoscopic techniques with possible need for an open approach.  Probable cholangiogram to evaluate the bilary tract was explained as well.    Risks such as bleeding, infection, abscess, leak, injury to other organs, need for further treatment, heart attack, death, and other risks were discussed.  I noted a good likelihood this will help address the problem.  Possibility that this will not correct all abdominal symptoms was explained.  Goals of post-operative recovery were discussed as well.  We will work to minimize complications.  An educational handout further explaining the pathology and treatment options was given as well.  Questions were answered.  The patient expresses understanding & wishes to proceed with surgery.  OR FINDINGS: Thickened gallbladder filled with numerous stones.  Impacted in infundibulum.  Chronic inflammation.  Liver: normal  Gravid uterus with infraumbilical apex.  DESCRIPTION:   The patient had fetal heart tones confirmed in the short stay holding area as recommended by her obstetrician, Dr. Osborn CohoAngela Roberts.  The patient was identified & brought in the operating room. The patient was positioned supine with arms tucked. SCDs were active during the entire case. The patient underwent general anesthesia without any difficulty.  The abdomen was prepped and draped in a sterile fashion. A Surgical Timeout confirmed our plan.  I made a transverse curvilinear incision through the superior umbilical fold.  I placed a 5mm long port through the supraumbilical fascia using a modified Hassan cutdown technique with umbilical stalk fascial countertraction. I began carbon dioxide insufflation.  No change in end tidal CO2 measurement.   Camera inspection revealed no injury.  An enlarged uterus consistent with her second trimester pregnancy was noted.  The  most superior aspect of it was infraumbilical.  There were no adhesions to the anterior abdominal wall supraumbilically.  I proceeded to continue with single site technique. I placed a #5 port in left upper aspect of the wound. I placed a 5 mm atraumatic grasper in the right inferior aspect of the wound.  I turned attention to the right upper quadrant.  The gallbladder fundus was grasped and elevated cephalad.  The gallbladder wall was quite thickened and leathery.  Chock full of stones so a challenge to grasp.  Elevation revealed some mesocolonic and duodenal adhesions especially near the infundibulum.  Consistent with chronic cholecystitis.  No frank purulence.  Liver looked normal.  I freed adhesions to the ventral surface of the gallbladder off carefully.  I freed the peritoneal coverings between the gallbladder and the liver on the posteriolateral and anteriomedial walls. I alternated between Harmonic & blunt Maryland dissection to help get a good critical view of the cystic artery and cystic duct. I did further dissection to free 50% of the gallbladder off the liver bed to get a good critical view of the infundibulum and cystic duct. I dissected out the cystic artery; and, after getting a good 360 view, ligated the anterior & posterior branches of the cystic artery close on the infundibulum using the Harmonic ultrasonic dissection.  I skeletonized the cystic duct.  It was shortened and thickened.  I decided to proceed with a dome down technique and freed the gallbladder from its remaining attachments to the liver and until it was only attached by the cystic duct itself.  Given the absence of markedly elevated liver function tests, choledocholithiasis, and radiation risk; I decided not proceed with any cholangiography.  Given the thickness of the cystic duct stump, I did ligated with a 0 PDS Endoloop.  I lasso around the entire gallbladder until it came down to the proximal cystic duct and ligated across  it.  I placed clips on the cystic duct x3.  I completed cystic duct transection.  I ensured hemostasis on the gallbladder fossa of the liver and elsewhere. I inspected the rest of the abdomen & detected no injury nor bleeding elsewhere.  I removed the gallbladder out the supraumbilical fascia.  I had open up the fascia to 25 mm since the gallbladder was very thick and chock full of numerous large stones.  I closed the fascia transversely using #1 PDS interrupted stitches. I closed the skin using 4-0 monocryl stitch.  Given her history of keloid formation, I injected the dermis with Kenalog 40 steroid diluted to 5 mm with 0.5% bupivacaine with epinephrine.  I then placed a thick layer of Dermabond over the incision to help cover it.  We then placed a fluffed gauze and Tegaderm for pressure dressing on top of it as well.  Hopefully these maneuvers will minimize the chance of hypertrophic keloid scarring.  The patient was extubated & arrived in the PACU in stable condition.  Sleepy but following commands.  Blood pressure and heart rate stable.  Fetal heart tones noted..  I had discussed postoperative care with the patient and her fianc in the office.  I also discussed with the patient and her mother in the holding area. I discussed operative findings, updated the patient's status, discussed probable steps to recovery, and gave postoperative recommendations to the Patient's mother.  Recommendations were made.  Questions were answered.  She expressed understanding &  appreciation.  I recommended close follow-up with the patient's obstetrician as well as myself.  [name], M.D., F.A.C.S. Gastrointestinal and Minimally Invasive Surgery [facility] [address], [address] [place], [place] [address] [phone] Main / Paging  05/17/2017 9:08 AM

## 2017-05-17 NOTE — Progress Notes (Signed)
Pt c/o itching all over primarily upper body,  upper chest, and arms.  No rash seen; Dr. Chancy MilroyHowser, anesthesiologist notified, med ordered; also consulted with pharmacy regarding having med while pregnant and is OK; med given

## 2017-05-17 NOTE — Progress Notes (Signed)
Pt requested to have her gall bladder; lab notifed, information supplied to pt to call after February 15th ; pt voiced understanding

## 2017-05-17 NOTE — Anesthesia Procedure Notes (Signed)
Procedure Name: Intubation Date/Time: 05/17/2017 7:36 AM Performed by: Jhonnie GarnerMarshall, Jannah Guardiola M, CRNA Pre-anesthesia Checklist: Patient identified, Emergency Drugs available, Suction available and Patient being monitored Patient Re-evaluated:Patient Re-evaluated prior to induction Oxygen Delivery Method: Circle system utilized Preoxygenation: Pre-oxygenation with 100% oxygen Induction Type: IV induction Ventilation: Mask ventilation without difficulty Laryngoscope Size: Miller and 2 Tube type: Oral Tube size: 7.0 mm Number of attempts: 1 Airway Equipment and Method: Stylet Placement Confirmation: ETT inserted through vocal cords under direct vision,  positive ETCO2 and breath sounds checked- equal and bilateral Secured at: 21 cm Tube secured with: Tape Dental Injury: Teeth and Oropharynx as per pre-operative assessment

## 2017-05-17 NOTE — Transfer of Care (Signed)
Immediate Anesthesia Transfer of Care Note  Patient: Ermalinda MemosOlivia Cheri' Lambert-Tucker  Procedure(s) Performed: LAPAROSCOPIC CHOLECYSTECTOMY SINGLE SITE (N/A ) STEROID INJECTION OF INCISION WITH KENALOG 40MG   Patient Location: PACU  Anesthesia Type:General  Level of Consciousness: awake, alert  and oriented  Airway & Oxygen Therapy: Patient Spontanous Breathing and Patient connected to face mask oxygen  Post-op Assessment: Report given to RN and Post -op Vital signs reviewed and stable  Post vital signs: Reviewed and stable  Last Vitals:  Vitals:   05/17/17 0559  BP: 111/66  Pulse: 86  Resp: 18  Temp: 36.6 C  SpO2: 100%    Last Pain:  Vitals:   05/17/17 0559  TempSrc: Oral      Patients Stated Pain Goal: 4 (05/17/17 0553)  Complications: No apparent anesthesia complications

## 2017-05-17 NOTE — Progress Notes (Signed)
Here to perform post op doppler of FHT's.  Pt resting now having just received dilaudid for pain.  FHT's 120's, fetal movement noted.

## 2017-05-18 ENCOUNTER — Encounter (HOSPITAL_COMMUNITY): Payer: Self-pay | Admitting: Surgery

## 2017-06-28 ENCOUNTER — Encounter (HOSPITAL_COMMUNITY): Payer: Self-pay

## 2017-06-28 ENCOUNTER — Inpatient Hospital Stay (HOSPITAL_COMMUNITY)
Admission: AD | Admit: 2017-06-28 | Discharge: 2017-06-29 | Disposition: A | Discharge status: Home / Self Care | Payer: Medicaid Other | Source: Ambulatory Visit | Attending: Obstetrics & Gynecology | Admitting: Obstetrics & Gynecology

## 2017-06-28 DIAGNOSIS — O26892 Other specified pregnancy related conditions, second trimester: Secondary | ICD-10-CM

## 2017-06-28 DIAGNOSIS — O212 Late vomiting of pregnancy: Secondary | ICD-10-CM | POA: Insufficient documentation

## 2017-06-28 DIAGNOSIS — R51 Headache: Secondary | ICD-10-CM | POA: Insufficient documentation

## 2017-06-28 DIAGNOSIS — Z3A22 22 weeks gestation of pregnancy: Secondary | ICD-10-CM | POA: Diagnosis not present

## 2017-06-28 LAB — URINALYSIS, ROUTINE W REFLEX MICROSCOPIC
Bilirubin Urine: NEGATIVE
Glucose, UA: NEGATIVE mg/dL
HGB URINE DIPSTICK: NEGATIVE
KETONES UR: 80 mg/dL — AB
LEUKOCYTES UA: NEGATIVE
Nitrite: NEGATIVE
Protein, ur: 30 mg/dL — AB
Specific Gravity, Urine: 1.021 (ref 1.005–1.030)
pH: 6 (ref 5.0–8.0)

## 2017-06-28 LAB — COMPREHENSIVE METABOLIC PANEL
ALBUMIN: 3.2 g/dL — AB (ref 3.5–5.0)
ALT: 63 U/L — AB (ref 14–54)
ANION GAP: 10 (ref 5–15)
AST: 37 U/L (ref 15–41)
Alkaline Phosphatase: 73 U/L (ref 38–126)
BUN: 5 mg/dL — ABNORMAL LOW (ref 6–20)
CHLORIDE: 105 mmol/L (ref 101–111)
CO2: 20 mmol/L — AB (ref 22–32)
Calcium: 9.2 mg/dL (ref 8.9–10.3)
Creatinine, Ser: 0.47 mg/dL (ref 0.44–1.00)
GFR calc non Af Amer: >60 mL/min (ref >60)
GLUCOSE: 80 mg/dL (ref 65–99)
Potassium: 3.5 mmol/L (ref 3.5–5.1)
SODIUM: 135 mmol/L (ref 135–145)
Total Bilirubin: 0.7 mg/dL (ref 0.3–1.2)
Total Protein: 6.7 g/dL (ref 6.5–8.1)

## 2017-06-28 LAB — CBC WITH DIFFERENTIAL/PLATELET
BASOS PCT: 0 %
Basophils Absolute: 0 10*3/uL (ref 0.0–0.1)
EOS ABS: 0.2 10*3/uL (ref 0.0–0.7)
EOS PCT: 1 %
HCT: 36.9 % (ref 36.0–46.0)
HEMOGLOBIN: 12.8 g/dL (ref 12.0–15.0)
Lymphocytes Relative: 26 %
Lymphs Abs: 2.9 10*3/uL (ref 0.7–4.0)
MCH: 30.4 pg (ref 26.0–34.0)
MCHC: 34.7 g/dL (ref 30.0–36.0)
MCV: 87.6 fL (ref 78.0–100.0)
Monocytes Absolute: 0.4 10*3/uL (ref 0.1–1.0)
Monocytes Relative: 4 %
NEUTROS PCT: 69 %
Neutro Abs: 7.6 10*3/uL (ref 1.7–7.7)
PLATELETS: 246 10*3/uL (ref 150–400)
RBC: 4.21 MIL/uL (ref 3.87–5.11)
RDW: 13.5 % (ref 11.5–15.5)
WBC: 11 10*3/uL — AB (ref 4.0–10.5)

## 2017-06-28 MED ORDER — BUTALBITAL-APAP-CAFFEINE 50-325-40 MG PO TABS
2.0000 | ORAL_TABLET | Freq: Once | ORAL | Status: AC
Start: 2017-06-28 — End: 2017-06-28
  Administered 2017-06-28: 2 via ORAL
  Filled 2017-06-28: qty 2

## 2017-06-28 MED ORDER — LACTATED RINGERS IV BOLUS (SEPSIS)
1000.0000 mL | Freq: Once | INTRAVENOUS | Status: AC
  Administered 2017-06-28: 1000 mL via INTRAVENOUS

## 2017-06-28 NOTE — MAU Note (Addendum)
Headache, vomiting starting Saturday.  Called Dr. Today at 1630 and left message.  Last took tylenol yesterday-didn't help.  Also having menstrual cramps.  Hx of preterm labor at 23 weeks but was able to stop it and have her baby at full term.  No VB/discharge.

## 2017-06-28 NOTE — Progress Notes (Addendum)
G2P1 @ 22.[redacted] wksga. Here dt ha, N/V/D that started on Saturday. No fever. Denies LOF or bleeding. +FM. Doppler done in triage  Provider in unit  Provider at bs assessing.   2255: Labs and IV done  2305: fiorcet given.   2307: [facility]

## 2017-06-29 MED ORDER — BUTALBITAL-APAP-CAFFEINE 50-325-40 MG PO TABS: 1.0000 | ORAL_TABLET | Freq: Four times a day (QID) | ORAL | 0 refills | Status: DC | PRN

## 2017-06-29 MED ORDER — BUTALBITAL-APAP-CAFFEINE 50-325-40 MG PO TABS: 2.0000 | ORAL_TABLET | Freq: Once | ORAL | Status: DC

## 2017-06-29 NOTE — MAU Provider Note (Signed)
Chief Complaint:  Headache and Emesis   None    HPI: Jill Aguilar is a 27 y.o. G2P1001 at [redacted]w[redacted]d who presents to maternity admissions reporting N/V and diarrhea since 3 days ago. Has vomited 3 times today. States also has a headache.  Has not taken any otc for it.  No hx of migraines.  Denies being exposed to any illnesses.  Denies bleeding or vaginal discharge.  FM+.  Pt states had gallbladder removed in January.  Location: Headache Quality: pressure Severity: 7/10 in pain scale Duration: on and off for 3 days. Modifying factors: Has not tried anything Associated signs and symptoms: N/V   Pregnancy Course:   Past Medical History:  Diagnosis Date  . Anxiety   . Depression   . Nausea   . Pregnancy 15 weeks as of 05-07-17   OB History  Gravida Para Term Preterm AB Living  2 1 1  0 0 1  SAB TAB Ectopic Multiple Live Births  0 0 0   1    # Outcome Date GA Lbr Len/2nd Weight Sex Delivery Anes PTL Lv  2 Current           1 Term 08/19/08 [redacted]w[redacted]d  2.693 kg (5 lb 15 oz) F CS-LTranv Spinal Y LIV     Complications: Fetal Intolerance     Past Surgical History:  Procedure Laterality Date  . CESAREAN SECTION     fetal intolerance  . LAPAROSCOPIC CHOLECYSTECTOMY SINGLE PORT N/A 05/17/2017   Procedure: LAPAROSCOPIC CHOLECYSTECTOMY SINGLE SITE;  Surgeon: Karie Soda, MD;  Location: [facility];  Service: General;  Laterality: N/A;  . STERIOD INJECTION  05/17/2017   Procedure: STEROID INJECTION OF INCISION WITH KENALOG 40MG ;  Surgeon: Karie Soda, MD;  Location: [facility];  Service: General;;  . WISDOM TOOTH EXTRACTION Bilateral 10/2002   Family History  Problem Relation Age of Onset  . Arthritis Mother   . Asthma Mother   . Asthma Brother   . Birth defects Brother   . Cancer Maternal Grandmother   . Diabetes Maternal Grandmother   . Hyperlipidemia Maternal Grandmother   . Hypertension Maternal Grandmother   . Arthritis Maternal Grandfather    Social History   Tobacco  Use  . Smoking status: Never Smoker  . Smokeless tobacco: Never Used  Substance Use Topics  . Alcohol use: No    Alcohol/week: 0.0 oz    Frequency: Never    Comment: none since pregnancy  . Drug use: Yes    Frequency: 1.0 times per week    Types: Marijuana    Comment: none since pregnancy   Allergies  Allergen Reactions  . Latex Itching    Pt itching post op where EKG leads, BP cuff, and tape touched her skin   Medications Prior to Admission  Medication Sig Dispense Refill Last Dose  . famotidine (PEPCID) 20 MG tablet Take 1 tablet (20 mg total) by mouth 2 (two) times daily. 60 tablet 0 05/16/2017 at Unknown time  . oxyCODONE (OXY IR/ROXICODONE) 5 MG immediate release tablet Take 1-2 tablets (5-10 mg total) by mouth every 6 (six) hours as needed for moderate pain, severe pain or breakthrough pain. 20 tablet 0   . Prenatal Vit-Fe Fumarate-FA (PRENATAL MULTIVITAMIN) TABS tablet Take 1 tablet by mouth daily at 12 noon.   Past Week at Unknown time    I have reviewed patient's Past Medical Hx, Surgical Hx, Family Hx, Social Hx, medications and allergies.   ROS:  Review of Systems  Constitutional:  Negative.   HENT: Negative.   Eyes: Negative.   Respiratory: Negative.   Cardiovascular: Negative.   Gastrointestinal: Positive for diarrhea, nausea and vomiting.  Endocrine: Negative.   Genitourinary: Negative.   Musculoskeletal: Negative.   Allergic/Immunologic: Negative.   Neurological: Positive for headaches.  Hematological: Negative.   Psychiatric/Behavioral: Negative.     Physical Exam   Patient Vitals for the past 24 hrs:  BP Temp Pulse Resp  06/28/17 2004 132/75 98.8 F (37.1 C) 79 19   Constitutional: Well-developed, well-nourished female in no acute distress.  Cardiovascular: normal rate Respiratory: normal effort GI: Abd soft, non-tender, gravid appropriate for gestational age. Pos BS x 4 FHT + MS: Extremities nontender, no edema, normal ROM Neurologic: Alert and  oriented x 4.  GU: defered FHT:  present  Labs: Results for orders placed or performed during the hospital encounter of 06/28/17 (from the past 24 hour(s))  Urinalysis, Routine w reflex microscopic     Status: Abnormal   Collection Time: 06/28/17  8:10 PM  Result Value Ref Range   Color, Urine AMBER (A) YELLOW   APPearance HAZY (A) CLEAR   Specific Gravity, Urine 1.021 1.005 - 1.030   pH 6.0 5.0 - 8.0   Glucose, UA NEGATIVE NEGATIVE mg/dL   Hgb urine dipstick NEGATIVE NEGATIVE   Bilirubin Urine NEGATIVE NEGATIVE   Ketones, ur 80 (A) NEGATIVE mg/dL   Protein, ur 30 (A) NEGATIVE mg/dL   Nitrite NEGATIVE NEGATIVE   Leukocytes, UA NEGATIVE NEGATIVE   RBC / HPF 0-5 0 - 5 RBC/hpf   WBC, UA 0-5 0 - 5 WBC/hpf   Bacteria, UA RARE (A) NONE SEEN   Squamous Epithelial / LPF 6-30 (A) NONE SEEN   Mucus PRESENT   CBC with Differential     Status: Abnormal   Collection Time: 06/28/17 10:55 PM  Result Value Ref Range   WBC 11.0 (H) 4.0 - 10.5 K/uL   RBC 4.21 3.87 - 5.11 MIL/uL   Hemoglobin 12.8 12.0 - 15.0 g/dL   HCT 96.236.9 95.236.0 - 84.146.0 %   MCV 87.6 78.0 - 100.0 fL   MCH 30.4 26.0 - 34.0 pg   MCHC 34.7 30.0 - 36.0 g/dL   RDW 32.413.5 40.111.5 - 02.715.5 %   Platelets 246 150 - 400 K/uL   Neutrophils Relative % 69 %   Neutro Abs 7.6 1.7 - 7.7 K/uL   Lymphocytes Relative 26 %   Lymphs Abs 2.9 0.7 - 4.0 K/uL   Monocytes Relative 4 %   Monocytes Absolute 0.4 0.1 - 1.0 K/uL   Eosinophils Relative 1 %   Eosinophils Absolute 0.2 0.0 - 0.7 K/uL   Basophils Relative 0 %   Basophils Absolute 0.0 0.0 - 0.1 K/uL  Comprehensive metabolic panel     Status: Abnormal   Collection Time: 06/28/17 10:55 PM  Result Value Ref Range   Sodium 135 135 - 145 mmol/L   Potassium 3.5 3.5 - 5.1 mmol/L   Chloride 105 101 - 111 mmol/L   CO2 20 (L) 22 - 32 mmol/L   Glucose, Bld 80 65 - 99 mg/dL   BUN 5 (L) 6 - 20 mg/dL   Creatinine, Ser 2.530.47 0.44 - 1.00 mg/dL   Calcium 9.2 8.9 - 66.410.3 mg/dL   Total Protein 6.7 6.5 - 8.1  g/dL   Albumin 3.2 (L) 3.5 - 5.0 g/dL   AST 37 15 - 41 U/L   ALT 63 (H) 14 - 54 U/L   Alkaline Phosphatase 73  38 - 126 U/L   Total Bilirubin 0.7 0.3 - 1.2 mg/dL   GFR calc non Af Amer >60 >60 mL/min   GFR calc Af Amer >60 >60 mL/min   Anion gap 10 5 - 15    Imaging:  No results found.  MAU Course: Orders Placed This Encounter  Procedures  . Urinalysis, Routine w reflex microscopic  . CBC with Differential  . Comprehensive metabolic panel   Meds ordered this encounter  Medications  . lactated ringers bolus 1,000 mL  . butalbital-acetaminophen-caffeine (FIORICET, ESGIC) 50-325-40 MG per tablet 2 tablet    MDM: PE and labs reviewed.  Will do IV hydration and Fioricet for headache.  If vomiting will give Zofran.  Will discharge home after fluids.  Fioricet helped with headache. Assessment: N/V in pregnancy Headache in pregnancy  Plan: Comfort measures discussed for discomforts.   Discharge home in stable condition.    Meds, PNV, Fioricet   Kenney Houseman, CNM 06/29/2017 12:01 AM

## 2017-09-21 ENCOUNTER — Encounter (HOSPITAL_COMMUNITY): Payer: Self-pay | Admitting: *Deleted

## 2017-09-21 ENCOUNTER — Inpatient Hospital Stay (HOSPITAL_COMMUNITY)
Admission: AD | Admit: 2017-09-21 | Discharge: 2017-09-21 | Disposition: A | Discharge status: Home / Self Care | Payer: Medicaid Other | Source: Ambulatory Visit | Attending: Obstetrics and Gynecology | Admitting: Obstetrics and Gynecology

## 2017-09-21 DIAGNOSIS — O26899 Other specified pregnancy related conditions, unspecified trimester: Secondary | ICD-10-CM

## 2017-09-21 DIAGNOSIS — R102 Pelvic and perineal pain: Secondary | ICD-10-CM | POA: Insufficient documentation

## 2017-09-21 DIAGNOSIS — Z3A34 34 weeks gestation of pregnancy: Secondary | ICD-10-CM | POA: Diagnosis not present

## 2017-09-21 DIAGNOSIS — O4690 Antepartum hemorrhage, unspecified, unspecified trimester: Secondary | ICD-10-CM | POA: Diagnosis not present

## 2017-09-21 DIAGNOSIS — O26893 Other specified pregnancy related conditions, third trimester: Secondary | ICD-10-CM | POA: Diagnosis not present

## 2017-09-21 HISTORY — DX: Cardiac murmur, unspecified: R01.1

## 2017-09-21 LAB — URINALYSIS, ROUTINE W REFLEX MICROSCOPIC
BILIRUBIN URINE: NEGATIVE
Glucose, UA: NEGATIVE mg/dL
Ketones, ur: 20 mg/dL — AB
NITRITE: NEGATIVE
Protein, ur: NEGATIVE mg/dL
SPECIFIC GRAVITY, URINE: 1.011 (ref 1.005–1.030)
pH: 7 (ref 5.0–8.0)

## 2017-09-21 NOTE — MAU Provider Note (Signed)
Chief Complaint:  Pelvic Pain and Vaginal Bleeding   First Provider Initiated Contact with Patient 09/21/17 1231     HPI: Jill Aguilar is a 27 y.o. G2P1001 at 13w3dwho presents to maternity admissions reporting pelvic pressure for several days and some pink discharge today.  Did have a pelvic exam yesterday.. She reports good fetal movement, denies LOF, vaginal bleeding, vaginal itching/burning, urinary symptoms, h/a, dizziness, n/v, diarrhea, constipation or fever/chills.  She denies headache, visual changes or RUQ abdominal pain.  Pelvic Pain  The patient's primary symptoms include pelvic pain and vaginal bleeding (pink). The patient's pertinent negatives include no genital itching, genital lesions or genital odor. This is a new problem. The current episode started today. The problem has been resolved. The pain is mild. She is pregnant. Pertinent negatives include no chills, constipation, diarrhea, fever, flank pain or hematuria. The vaginal discharge was bloody. The vaginal bleeding is spotting. She has not been passing clots. She has not been passing tissue. Nothing aggravates the symptoms. She has tried nothing for the symptoms.  Vaginal Bleeding  The patient's primary symptoms include pelvic pain and vaginal bleeding (pink). The patient's pertinent negatives include no genital itching, genital lesions or genital odor. The current episode started today. The problem occurs rarely. The problem has been resolved. Pertinent negatives include no chills, constipation, diarrhea, fever, flank pain or hematuria. Vaginal discharge characteristics: pink. The vaginal bleeding is spotting.    RN Note: Pt reports lower abd pressure for a few days, pink discharge today.     Past Medical History: Past Medical History:  Diagnosis Date  . Anxiety   . Depression   . Heart murmur    childhood  . Nausea   . Pregnancy 15 weeks as of 05-07-17    Past obstetric history: OB History  Gravida  Para Term Preterm AB Living  2 1 1  0 0 1  SAB TAB Ectopic Multiple Live Births  0 0 0   1    # Outcome Date GA Lbr Len/2nd Weight Sex Delivery Anes PTL Lv  2 Current           1 Term 08/19/08 [redacted]w[redacted]d  5 lb 15 oz (2.693 kg) F CS-LTranv Spinal Y LIV     Complications: Fetal Intolerance    Past Surgical History: Past Surgical History:  Procedure Laterality Date  . CESAREAN SECTION     fetal intolerance  . LAPAROSCOPIC CHOLECYSTECTOMY SINGLE PORT N/A 05/17/2017   Procedure: LAPAROSCOPIC CHOLECYSTECTOMY SINGLE SITE;  Surgeon: Karie Soda, MD;  Location: [facility];  Service: General;  Laterality: N/A;  . STERIOD INJECTION  05/17/2017   Procedure: STEROID INJECTION OF INCISION WITH KENALOG 40MG ;  Surgeon: Karie Soda, MD;  Location: [facility];  Service: General;;  . WISDOM TOOTH EXTRACTION Bilateral 10/2002    Family History: Family History  Problem Relation Age of Onset  . Arthritis Mother   . Asthma Mother   . Asthma Brother   . Birth defects Brother   . Cancer Maternal Grandmother   . Diabetes Maternal Grandmother   . Hyperlipidemia Maternal Grandmother   . Hypertension Maternal Grandmother   . Arthritis Maternal Grandfather     Social History: Social History   Tobacco Use  . Smoking status: Never Smoker  . Smokeless tobacco: Never Used  Substance Use Topics  . Alcohol use: No    Alcohol/week: 0.0 oz    Frequency: Never    Comment: none since pregnancy  . Drug use: Not Currently  Frequency: 1.0 times per week    Types: Marijuana    Comment: none since pregnancy    Allergies:  Allergies  Allergen Reactions  . Latex Itching    Pt itching post op where EKG leads, BP cuff, and tape touched her skin    Meds:  Medications Prior to Admission  Medication Sig Dispense Refill Last Dose  . Prenatal Vit-Fe Fumarate-FA (PRENATAL MULTIVITAMIN) TABS tablet Take 1 tablet by mouth daily at 12 noon.   09/20/2017 at Unknown time  . terconazole (TERAZOL 3) 80 MG vaginal suppository  Place 80 mg vaginally at bedtime. 3 day course   09/20/2017 at Unknown time  . butalbital-acetaminophen-caffeine (FIORICET, ESGIC) 50-325-40 MG tablet Take 1-2 tablets by mouth every 6 (six) hours as needed for headache. (Patient not taking: Reported on 09/21/2017) 20 tablet 0 Not Taking at Unknown time  . famotidine (PEPCID) 20 MG tablet Take 1 tablet (20 mg total) by mouth 2 (two) times daily. (Patient not taking: Reported on 09/21/2017) 60 tablet 0 Not Taking at Unknown time    I have reviewed patient's Past Medical Hx, Surgical Hx, Family Hx, Social Hx, medications and allergies.   ROS:  Review of Systems  Constitutional: Negative for chills and fever.  Gastrointestinal: Negative for constipation and diarrhea.  Genitourinary: Positive for pelvic pain and vaginal bleeding. Negative for flank pain and hematuria.   Other systems negative  Physical Exam   Patient Vitals for the past 24 hrs:  BP Temp Pulse Resp SpO2  09/21/17 1139 115/74 98 F (36.7 C) 81 16 100 %   Constitutional: Well-developed, well-nourished female in no acute distress.  Cardiovascular: normal rate and rhythm Respiratory: normal effort, clear to auscultation bilaterally GI: Abd soft, non-tender, gravid appropriate for gestational age.   No rebound or guarding. MS: Extremities nontender, no edema, normal ROM Neurologic: Alert and oriented x 4.  GU: Neg CVAT.  PELVIC EXAM: Cervix pink, visually closed, without lesion, scant white creamy discharge, vaginal walls and external genitalia normal    NO BLOOD VISIBLE IN VAULT Dilation: Closed Effacement (%): Thick Station: Ballotable Exam by:: Artelia LarocheM. Kayal Mula CNM  FHT:  Baseline 140 , moderate variability, accelerations present, no decelerations Contractions:   Rare   Labs: Results for orders placed or performed during the hospital encounter of 09/21/17 (from the past 24 hour(s))  Urinalysis, Routine w reflex microscopic     Status: Abnormal   Collection Time: 09/21/17  11:32 AM  Result Value Ref Range   Color, Urine YELLOW YELLOW   APPearance CLOUDY (A) CLEAR   Specific Gravity, Urine 1.011 1.005 - 1.030   pH 7.0 5.0 - 8.0   Glucose, UA NEGATIVE NEGATIVE mg/dL   Hgb urine dipstick SMALL (A) NEGATIVE   Bilirubin Urine NEGATIVE NEGATIVE   Ketones, ur 20 (A) NEGATIVE mg/dL   Protein, ur NEGATIVE NEGATIVE mg/dL   Nitrite NEGATIVE NEGATIVE   Leukocytes, UA LARGE (A) NEGATIVE   RBC / HPF 6-10 0 - 5 RBC/hpf   WBC, UA 11-20 0 - 5 WBC/hpf   Bacteria, UA MANY (A) NONE SEEN   Squamous Epithelial / LPF 21-50 0 - 5   Mucus PRESENT       Imaging:  No results found.  MAU Course/MDM: I have ordered labs and reviewed results. Urine suggestive of possible UTI, will send to culture. Cannot do fetal fibronectin due to report of blood.  NST reviewed, reactive Consult Dr Su Hiltoberts with presentation, exam findings and test results. Will discharge home. Urine to  culture, will not treat at this point until resulted.  Assessment: Single intrauterine pregnancy at [redacted]w[redacted]d Pelvic pressure Pink discharge, now resolved  Plan: Discharge home Preterm Labor precautions and fetal kick counts Follow up in Office for prenatal visits and recheck of cervix  Encouraged to return here or to other Urgent Care/ED if she develops worsening of symptoms, increase in pain, fever, or other concerning symptoms.  Pt stable at time of discharge.  Wynelle Bourgeois CNM, MSN Certified Nurse-Midwife 09/21/2017 12:32 PM

## 2017-09-21 NOTE — MAU Note (Signed)
Pt reports lower abd pressure for a few days, pink discharge today.

## 2017-09-21 NOTE — Discharge Instructions (Signed)

## 2017-09-23 LAB — CULTURE, OB URINE

## 2017-09-28 LAB — OB RESULTS CONSOLE GBS: STREP GROUP B AG: POSITIVE

## 2017-10-17 ENCOUNTER — Telehealth (HOSPITAL_COMMUNITY): Payer: Self-pay | Admitting: *Deleted

## 2017-10-17 ENCOUNTER — Encounter (HOSPITAL_COMMUNITY): Payer: Self-pay | Admitting: *Deleted

## 2017-10-17 NOTE — Telephone Encounter (Signed)
Preadmission screen  

## 2017-10-18 ENCOUNTER — Encounter (HOSPITAL_COMMUNITY): Payer: Self-pay

## 2017-10-18 ENCOUNTER — Other Ambulatory Visit: Payer: Self-pay

## 2017-10-18 ENCOUNTER — Inpatient Hospital Stay (HOSPITAL_COMMUNITY)
Admission: AD | Admit: 2017-10-18 | Discharge: 2017-10-20 | DRG: 807 | Disposition: A | Discharge status: Home / Self Care | Payer: Medicaid Other | Attending: Obstetrics & Gynecology | Admitting: Obstetrics & Gynecology

## 2017-10-18 DIAGNOSIS — O34219 Maternal care for unspecified type scar from previous cesarean delivery: Secondary | ICD-10-CM | POA: Diagnosis present

## 2017-10-18 DIAGNOSIS — K8020 Calculus of gallbladder without cholecystitis without obstruction: Secondary | ICD-10-CM | POA: Diagnosis present

## 2017-10-18 DIAGNOSIS — Z3A38 38 weeks gestation of pregnancy: Secondary | ICD-10-CM | POA: Diagnosis not present

## 2017-10-18 DIAGNOSIS — O99824 Streptococcus B carrier state complicating childbirth: Secondary | ICD-10-CM | POA: Diagnosis present

## 2017-10-18 DIAGNOSIS — O9962 Diseases of the digestive system complicating childbirth: Secondary | ICD-10-CM | POA: Diagnosis present

## 2017-10-18 DIAGNOSIS — Z3483 Encounter for supervision of other normal pregnancy, third trimester: Secondary | ICD-10-CM | POA: Diagnosis present

## 2017-10-18 LAB — TYPE AND SCREEN
ABO/RH(D): A POS
Antibody Screen: NEGATIVE

## 2017-10-18 LAB — CBC
HCT: 40.4 % (ref 36.0–46.0)
HEMOGLOBIN: 13.9 g/dL (ref 12.0–15.0)
MCH: 30.3 pg (ref 26.0–34.0)
MCHC: 34.4 g/dL (ref 30.0–36.0)
MCV: 88.2 fL (ref 78.0–100.0)
Platelets: 190 10*3/uL (ref 150–400)
RBC: 4.58 MIL/uL (ref 3.87–5.11)
RDW: 12.9 % (ref 11.5–15.5)
WBC: 9.2 10*3/uL (ref 4.0–10.5)

## 2017-10-18 LAB — ABO/RH: ABO/RH(D): A POS

## 2017-10-18 LAB — POCT FERN TEST: POCT Fern Test: POSITIVE

## 2017-10-18 MED ORDER — LACTATED RINGERS IV SOLN
INTRAVENOUS | Status: DC
  Administered 2017-10-18: 03:00:00 via INTRAVENOUS

## 2017-10-18 MED ORDER — PRENATAL MULTIVITAMIN CH
1.0000 | ORAL_TABLET | Freq: Every day | ORAL | Status: DC
  Administered 2017-10-18 – 2017-10-20 (×3): 1 via ORAL
  Filled 2017-10-18 (×3): qty 1

## 2017-10-18 MED ORDER — KETOROLAC TROMETHAMINE 30 MG/ML IJ SOLN
30.0000 mg | Freq: Once | INTRAMUSCULAR | Status: AC
  Administered 2017-10-18: 30 mg via INTRAVENOUS
  Filled 2017-10-18: qty 1

## 2017-10-18 MED ORDER — SOD CITRATE-CITRIC ACID 500-334 MG/5ML PO SOLN: 30.0000 mL | ORAL | Status: DC | PRN

## 2017-10-18 MED ORDER — WITCH HAZEL-GLYCERIN EX PADS: 1.0000 "application " | MEDICATED_PAD | CUTANEOUS | Status: DC | PRN

## 2017-10-18 MED ORDER — ONDANSETRON HCL 4 MG/2ML IJ SOLN: 4.0000 mg | INTRAMUSCULAR | Status: DC | PRN

## 2017-10-18 MED ORDER — IBUPROFEN 600 MG PO TABS
600.0000 mg | ORAL_TABLET | Freq: Four times a day (QID) | ORAL | Status: DC
  Administered 2017-10-18 – 2017-10-20 (×10): 600 mg via ORAL
  Filled 2017-10-18 (×9): qty 1

## 2017-10-18 MED ORDER — FENTANYL CITRATE (PF) 100 MCG/2ML IJ SOLN
INTRAMUSCULAR | Status: AC
  Filled 2017-10-18: qty 2

## 2017-10-18 MED ORDER — OXYCODONE-ACETAMINOPHEN 5-325 MG PO TABS: 2.0000 | ORAL_TABLET | ORAL | Status: DC | PRN

## 2017-10-18 MED ORDER — FENTANYL CITRATE (PF) 100 MCG/2ML IJ SOLN
100.0000 ug | Freq: Once | INTRAMUSCULAR | Status: AC
  Administered 2017-10-18: 100 ug via INTRAVENOUS

## 2017-10-18 MED ORDER — ZOLPIDEM TARTRATE 5 MG PO TABS: 5.0000 mg | ORAL_TABLET | Freq: Every evening | ORAL | Status: DC | PRN

## 2017-10-18 MED ORDER — LIDOCAINE HCL (PF) 1 % IJ SOLN
30.0000 mL | INTRAMUSCULAR | Status: AC | PRN
  Administered 2017-10-18: 30 mL via SUBCUTANEOUS
  Filled 2017-10-18: qty 30

## 2017-10-18 MED ORDER — DIPHENHYDRAMINE HCL 25 MG PO CAPS: 25.0000 mg | ORAL_CAPSULE | Freq: Four times a day (QID) | ORAL | Status: DC | PRN

## 2017-10-18 MED ORDER — ACETAMINOPHEN 325 MG PO TABS
650.0000 mg | ORAL_TABLET | ORAL | Status: DC | PRN
  Administered 2017-10-18 – 2017-10-19 (×2): 650 mg via ORAL
  Filled 2017-10-18 (×2): qty 2

## 2017-10-18 MED ORDER — SODIUM CHLORIDE 0.9 % IV SOLN
2.0000 g | Freq: Once | INTRAVENOUS | Status: AC
  Administered 2017-10-18: 2 g via INTRAVENOUS
  Filled 2017-10-18: qty 2

## 2017-10-18 MED ORDER — DIBUCAINE 1 % RE OINT: 1.0000 "application " | TOPICAL_OINTMENT | RECTAL | Status: DC | PRN

## 2017-10-18 MED ORDER — OXYCODONE-ACETAMINOPHEN 5-325 MG PO TABS: 1.0000 | ORAL_TABLET | ORAL | Status: DC | PRN

## 2017-10-18 MED ORDER — OXYTOCIN 40 UNITS IN LACTATED RINGERS INFUSION - SIMPLE MED
2.5000 [IU]/h | INTRAVENOUS | Status: DC
  Administered 2017-10-18: 2.5 [IU]/h via INTRAVENOUS
  Filled 2017-10-18: qty 1000

## 2017-10-18 MED ORDER — SENNOSIDES-DOCUSATE SODIUM 8.6-50 MG PO TABS
2.0000 | ORAL_TABLET | ORAL | Status: DC
  Administered 2017-10-19 (×2): 2 via ORAL
  Filled 2017-10-18 (×2): qty 2

## 2017-10-18 MED ORDER — FLEET ENEMA 7-19 GM/118ML RE ENEM: 1.0000 | ENEMA | RECTAL | Status: DC | PRN

## 2017-10-18 MED ORDER — ACETAMINOPHEN 325 MG PO TABS: 650.0000 mg | ORAL_TABLET | ORAL | Status: DC | PRN

## 2017-10-18 MED ORDER — ONDANSETRON HCL 4 MG PO TABS: 4.0000 mg | ORAL_TABLET | ORAL | Status: DC | PRN

## 2017-10-18 MED ORDER — FENTANYL CITRATE (PF) 100 MCG/2ML IJ SOLN
100.0000 ug | Freq: Once | INTRAMUSCULAR | Status: AC
  Administered 2017-10-18: 100 ug via INTRAVENOUS
  Filled 2017-10-18: qty 2

## 2017-10-18 MED ORDER — BENZOCAINE-MENTHOL 20-0.5 % EX AERO
1.0000 "application " | INHALATION_SPRAY | CUTANEOUS | Status: DC | PRN
  Administered 2017-10-18: 1 via TOPICAL
  Filled 2017-10-18: qty 56

## 2017-10-18 MED ORDER — TETANUS-DIPHTH-ACELL PERTUSSIS 5-2.5-18.5 LF-MCG/0.5 IM SUSP: 0.5000 mL | Freq: Once | INTRAMUSCULAR | Status: DC

## 2017-10-18 MED ORDER — COCONUT OIL OIL
1.0000 "application " | TOPICAL_OIL | Status: DC | PRN
  Filled 2017-10-18: qty 120

## 2017-10-18 MED ORDER — OXYTOCIN BOLUS FROM INFUSION
500.0000 mL | Freq: Once | INTRAVENOUS | Status: AC
  Administered 2017-10-18: 500 mL via INTRAVENOUS

## 2017-10-18 MED ORDER — LACTATED RINGERS IV SOLN: 500.0000 mL | INTRAVENOUS | Status: DC | PRN

## 2017-10-18 MED ORDER — ONDANSETRON HCL 4 MG/2ML IJ SOLN: 4.0000 mg | Freq: Four times a day (QID) | INTRAMUSCULAR | Status: DC | PRN

## 2017-10-18 MED ORDER — SIMETHICONE 80 MG PO CHEW: 80.0000 mg | CHEWABLE_TABLET | ORAL | Status: DC | PRN

## 2017-10-18 NOTE — Lactation Note (Signed)
This note was copied from a baby's chart. Lactation Consultation Note  Patient Name: Jill Aguilar Reason for consult: Initial assessment;Early term 7437-38.6wks  P2 mother whose infant is now 2 hours old.  Mother breastfed her first child for a few weeks.  Mother's breasts are soft and non tender and nipples are flat.  The left nipple inverts with hand expression.  #20 NS used to latch baby onto the left breast in the football hold.  After a couple of attempts baby was able to latch and suck effectively.  Lips flanged and small amount of colostrum noted on areola at the end of her feeding.  Finger fed colostrum drops to baby.  Encouraged mother to feed 8-12 times/24 hours or earlier if baby shows feeding cues.  Reviewed cues.  Continue STS, breast massage and hand expression after feeds.  Breast shells and manual pump provided with instructions for use.  Wash basin and soap given.  Mother will call for assistance as needed.  Family present.   Maternal Data Formula Feeding for Exclusion: No Does the patient have breastfeeding experience prior to this delivery?: Yes  Feeding Feeding Type: Breast Fed Length of feed: 10 min  LATCH Score Latch: Grasps breast easily, tongue down, lips flanged, rhythmical sucking.  Audible Swallowing: None  Type of Nipple: Flat  Comfort (Breast/Nipple): Soft / non-tender  Hold (Positioning): Assistance needed to correctly position infant at breast and maintain latch.  LATCH Score: 6  Interventions Interventions: Breast feeding basics reviewed;Assisted with latch;Skin to skin;Breast massage;Position options;Support pillows;Adjust position;Breast compression;Shells;Hand pump  Lactation Tools Discussed/Used Tools: Shells;Pump;Nipple Shields Nipple shield size: 20 Shell Type: Inverted Breast pump type: Manual WIC Program: Yes   Consult Status Consult Status: Follow-up Date: 10/19/17 Follow-up type:  In-patient    Rashaud Ybarbo R Kember Boch Aguilar, 6:57 AM

## 2017-10-18 NOTE — Progress Notes (Signed)

## 2017-10-18 NOTE — MAU Note (Signed)
Pt states her water broke 30 minutes ago.   No bleeding  Reports good fetal movement

## 2017-10-18 NOTE — H&P (Signed)
Jill Aguilar is a 27 y.o. female, G2P1001, IUP at 38.2 weeks, presenting for spontaneous labor for TLOC. Pt ednores rupture of clear fluids at  Home at 0200 this morning, at that time there were no contractions then pt started contracting and wants and epidural. Pt in pain and request pain meds IV now . Pt endorse + Fm. Denies vaginal bleeding. GBS+  Patient Active Problem List   Diagnosis Date Noted  . Normal labor 10/18/2017  . Cholelithiasis affecting pregnancy in second trimester, antepartum 05/17/2017  . Nausea and vomiting 03/08/2017  . Nausea and vomiting in pregnancy 03/07/2017  . Symptomatic cholelithiasis 02/15/2017  . Chronic cholecystitis s/p lap cholecystectomy 05/17/2017   . 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 PTSD (post-traumatic stress disorder) 03/16/2015  . ADHD (attention deficit hyperactivity disorder), combined type 03/16/2015    Prenatal Problem: Group B Streptococcus carrier (Rx in labor.)deliveries by cesarean (NRFT's at 37 weeks. Desires a TOLAC) vitamin D deficiency (17 03/2017--on 4000 IU daily) high risk pregnancy due to history of preterm labor (PTL at 23 weeks, delivered at 53. steroids and BR. Cervical length at 11 weeks Discussed 17P at 16 weeks.  Records requested) Hyperemesis (Hospitalized 11/21-11/23, IV hydration, antiemetics.) Cholecystitis (Followed by surgery. PT HAD LAP CHOLECYSTECTOMY 04/2017)  Prenatal meds: Medications Prior to Admission  Medication Sig Dispense Refill Last Dose  . Prenatal Vit-Fe Fumarate-FA (PRENATAL MULTIVITAMIN) TABS tablet Take 1 tablet by mouth daily at 12 noon.   09/20/2017 at Unknown time  . terconazole (TERAZOL 3) 80 MG vaginal suppository Place 80 mg vaginally at bedtime. 3 day course   09/20/2017 at Unknown time    Past Medical History:  Diagnosis Date  . Anxiety   . Depression   . Heart murmur    childhood  . Hx of chlamydia infection   . Nausea   . Pregnancy 15 weeks as of 05-07-17  . Vitamin D  deficiency      No current facility-administered medications on file prior to encounter.    Current Outpatient Medications on File Prior to Encounter  Medication Sig Dispense Refill  . Prenatal Vit-Fe Fumarate-FA (PRENATAL MULTIVITAMIN) TABS tablet Take 1 tablet by mouth daily at 12 noon.    Marland Kitchen terconazole (TERAZOL 3) 80 MG vaginal suppository Place 80 mg vaginally at bedtime. 3 day course       Allergies  Allergen Reactions  . Latex Itching    Pt itching post op where EKG leads, BP cuff, and tape touched her skin    History of present pregnancy: Pt Info/Preference:  Screening/Consents:  Labs:   EDD: Estimated Date of Delivery: 10/30/17  Establised: Patient's last menstrual period was 01/13/2017 (exact date).  Anatomy Scan: Date: 06/14/2017 Placenta Location: Anterior Genetic Screen: Panoroma:Normal_BG  AFP:  First Tri: Quad:  Office: [facility]           First PNV: 7.3 weeks Blood Type --/--/A POS, A POS Performed at [facility], [address] [place], [place] [address]  [phone]/04 0350)  Language: English Last PNV: 38.1 weeks Rhogam    Flu Vaccine:  Decllined   Antibody NEG (07/04 0350)  TDaP vaccine Declined   GTT: Early: N/A Third Trimester: Negative  Feeding Plan: ??? BTL: NO Rubella: Immune (11/20 0000)  Contraception: ??? VBAC: YES consent signed in [place] RPR: Nonreactive (11/20 0000)   Circumcision: No-BG   HBsAg: Negative (11/20 0000)  Pediatrician:  ???   HIV: Non-reactive (11/20 0000)   Prenatal Classes: No Additional [place]: No GBS: Positive (06/14 0000)(For  PCN allergy, check sensitivities)       Chlamydia: Neg in this pregnant per pt h/o     MFM Referral/Consult:  GC: neg  Support Person: Husband   PAP: 2017 per pt normal  Pain Management: Wants epidural  Neonatologist Referral:  Hgb Electrophoresis:  AA  Birth Plan: No   Hgb NOB: 14.9   28W: 13.3  Anatomy scan 06/14/2017:  OB History    Gravida  2   Para  1   Term  1   Preterm  0   AB  0   Living  1       SAB  0   TAB  0   Ectopic  0   Multiple      Live Births  1          Past Medical History:  Diagnosis Date  . Anxiety   . Depression   . Heart murmur    childhood  . Hx of chlamydia infection   . Nausea   . Pregnancy 15 weeks as of 05-07-17  . Vitamin D deficiency    Past Surgical History:  Procedure Laterality Date  . CESAREAN SECTION     fetal intolerance  . LAPAROSCOPIC CHOLECYSTECTOMY SINGLE PORT N/A 05/17/2017   Procedure: LAPAROSCOPIC CHOLECYSTECTOMY SINGLE SITE;  Surgeon: Karie SodaGross, Steven, MD;  Location: [facility];  Service: General;  Laterality: N/A;  . STERIOD INJECTION  05/17/2017   Procedure: STEROID INJECTION OF INCISION WITH KENALOG 40MG ;  Surgeon: Karie SodaGross, Steven, MD;  Location: [facility];  Service: General;;  . WISDOM TOOTH EXTRACTION Bilateral 10/2002   Family History: family history includes Arthritis in her maternal grandfather and mother; Asthma in her brother and mother; Birth defects in her brother; Cancer in her maternal grandmother; Diabetes in her maternal grandmother; Hyperlipidemia in her maternal grandmother; Hypertension in her maternal grandmother. Social History:  reports that she has never smoked. She has never used smokeless tobacco. She reports that she has current or past drug history. Drug: Marijuana. Frequency: 1.00 time per week. She reports that she does not drink alcohol.   Prenatal Transfer Tool  Maternal Diabetes: No Genetic Screening: Normal Maternal Ultrasounds/Referrals: Normal Fetal Ultrasounds or other Referrals:  None Maternal Substance Abuse:  No Significant Maternal Medications:  None Significant Maternal Lab Results: None  ROS:  Review of Systems  Gastrointestinal: Positive for abdominal pain.  Genitourinary:       Leakage of fluids  All other systems reviewed and are negative.    Physical Exam: BP 140/78   Pulse 84   Temp 98.2 F (36.8 C) (Axillary)   Resp 18   Ht 5\' 7"  (1.702 m)   Wt 79.8 kg (176 lb)   LMP  01/13/2017 (Exact Date)   SpO2 100%   BMI 27.57 kg/m   Physical Exam  Constitutional: She is oriented to person, place, and time and well-developed, well-nourished, and in no distress.  HENT:  Head: Normocephalic and atraumatic.  Eyes: Pupils are equal, round, and reactive to light. Conjunctivae are normal.  Neck: Normal range of motion. Neck supple.  Cardiovascular: Normal rate.  Pulmonary/Chest: Effort normal and breath sounds normal.  Abdominal: Soft. Bowel sounds are normal.  Genitourinary:  Genitourinary Comments: Uterus gravida equal to dates, pelvic adequate SVE: performed by RN in MAU    Musculoskeletal: Normal range of motion.  Neurological: She is alert and oriented to person, place, and time.  Skin: Skin is warm and dry.  Psychiatric: Affect normal.  Nursing note and  vitals reviewed.    NST: FHR baseline 140 bpm, Variability: moderate, Accelerations:not present, Decelerations:  Absent= Cat 1/Reactive UC:   Unable to trace, appears to be every 1 min and lasting 3 mins, pt in pain in MAU, Rn REPORTS PT PROGRESSING FAST  SVE:   Dilation: 10 Effacement (%): 100 Station: Plus 1 Exam by:: [facility], vertex verified by fetal sutures.   Labs: Results for orders placed or performed during the hospital encounter of 10/18/17 (from the past 24 hour(s))  Fern Test     Status: None   Collection Time: 10/18/17  3:04 AM  Result Value Ref Range   POCT Fern Test Positive = ruptured amniotic membanes   CBC     Status: None   Collection Time: 10/18/17  3:13 AM  Result Value Ref Range   WBC 9.2 4.0 - 10.5 K/uL   RBC 4.58 3.87 - 5.11 MIL/uL   Hemoglobin 13.9 12.0 - 15.0 g/dL   HCT 16.1 09.6 - 04.5 %   MCV 88.2 78.0 - 100.0 fL   MCH 30.3 26.0 - 34.0 pg   MCHC 34.4 30.0 - 36.0 g/dL   RDW 40.9 81.1 - 91.4 %   Platelets 190 150 - 400 K/uL  Type and screen     Status: None   Collection Time: 10/18/17  3:50 AM  Result Value Ref Range   ABO/RH(D) A POS    Antibody Screen NEG     Sample Expiration      10/21/2017 Performed at [facility], [address], [place], [place] [address]   ABO/Rh     Status: None   Collection Time: 10/18/17  3:50 AM  Result Value Ref Range   ABO/RH(D)      A POS Performed at [facility], [address], [place], [place] [address]     Imaging:  No results found.  MAU Course: Orders Placed This Encounter  Procedures  . CBC  . RPR  . Diet clear liquid Room service appropriate? Yes; Fluid consistency: Thin  . Vital signs  . Notify Physician  . Activity as tolerated  . Fetal monitoring per unit policy  . Cervical Exam  . Measure blood pressure post delivery every 15 min x 1 hour then every 30 min x 1 hour  . Fundal check post delivery every 15 min x 1 hour then every 30 min x 1 hour  . If Rapid HIV test positive or known HIV positive: initiate AZT orders  . May in and out cath x 2 for inability to void  . Insert foley catheter  . Discontinue foley prior to vaginal delivery  . Initiate Oral Care Protocol  . Initiate Carrier Fluid Protocol  . Full code  . Oxygen therapy  . Nitrous Oxide 50%/Oxygen 50%  . Fern Test  . Type and screen  . ABO/Rh  . Insert and maintain IV Line  . Admit to Inpatient (patient's expected length of stay will be greater than 2 midnights or inpatient only procedure)   Meds ordered this encounter  Medications  . lactated ringers infusion  . oxytocin (PITOCIN) IV BOLUS FROM BAG  . oxytocin (PITOCIN) IV infusion 40 units in LR 1000 mL - Premix  . lactated ringers infusion 500-1,000 mL  . acetaminophen (TYLENOL) tablet 650 mg  . oxyCODONE-acetaminophen (PERCOCET/ROXICET) 5-325 MG per tablet 1 tablet  . oxyCODONE-acetaminophen (PERCOCET/ROXICET) 5-325 MG per tablet 2 tablet  . sodium phosphate (FLEET) 7-19 GM/118ML enema 1 enema  . ondansetron (ZOFRAN) injection  4 mg  . sodium citrate-citric acid (ORACIT) solution 30 mL  . lidocaine (PF) (XYLOCAINE) 1 % injection 30 mL  . ampicillin  (OMNIPEN) 2 g in sodium chloride 0.9 % 100 mL IVPB    Order Specific Question:   Antibiotic Indication:    Answer:   Group B Strep Prophylaxis  . fentaNYL (SUBLIMAZE) injection 100 mcg  . fentaNYL (SUBLIMAZE) 100 MCG/2ML injection    Gearldine Bienenstock   : cabinet override  . fentaNYL (SUBLIMAZE) injection 100 mcg  . ketorolac (TORADOL) 30 MG/ML injection 30 mg    Assessment/Plan: Jill Aguilar is a 27 y.o. female, G2P1001, IUP at 38.8 weeks, presenting for spontaneous labor for a TLOC. SROM cleared fluids at 0200 without contraction, then contractions began fast when present to MAU. Consent signed in [place]. Pt stable in pain wants epidural, waiting for platelets to result.    FWB: Cat 1 Fetal Tracing.   Plan: Admit to [facility] Suite per consult with Dr Sallye Ober Routine [facility] orders Pain med/epidural prn Ancef for GBS prophylaxis Anticipate labor progression   Izmael Duross NP-C, CNM, MSN 10/18/2017, 4:40 AM

## 2017-10-18 NOTE — Consult Note (Signed)
Delivery Note:  Requested to attend delivery for vacuum-assist.  NICU delivery team dismissed when they arrived since infant was just delivered and stable.   [place]ton MamMary Ann T Jaking Thayer, MD (Attending Neonatologist)

## 2017-10-19 LAB — CBC
HCT: 30.4 % — ABNORMAL LOW (ref 36.0–46.0)
HEMOGLOBIN: 10.5 g/dL — AB (ref 12.0–15.0)
MCH: 30.7 pg (ref 26.0–34.0)
MCHC: 34.5 g/dL (ref 30.0–36.0)
MCV: 88.9 fL (ref 78.0–100.0)
PLATELETS: 185 10*3/uL (ref 150–400)
RBC: 3.42 MIL/uL — AB (ref 3.87–5.11)
RDW: 13 % (ref 11.5–15.5)
WBC: 10.1 10*3/uL (ref 4.0–10.5)

## 2017-10-19 NOTE — Progress Notes (Signed)
Offered Tdap Vaccine information sheet to patient; patient states, "My provider gave me the information prior to delivery".  Discussed risks of pertussis to newborn infant; patient states, "I am just not a vaccine type person".  Will continue to monitor.

## 2017-10-19 NOTE — Progress Notes (Signed)
Post Partum Day 1 Subjective: no complaints, up ad lib, voiding and tolerating PO  Objective: Vitals:   10/18/17 0618 10/18/17 0659 10/18/17 1420 10/18/17 1803  BP: 122/73 115/64 119/74 128/81  Pulse: 67 67 77 71  Resp: 18 18 18 18   Temp: 97.9 F (36.6 C) 98.3 F (36.8 C) 97.8 F (36.6 C) 97.9 F (36.6 C)  TempSrc: Oral Oral Oral Oral  SpO2:   97%   Weight:      Height:        Physical Exam:  General: alert and cooperative Lochia: appropriate Uterine Fundus: firm Incision: n/a DVT Evaluation: No evidence of DVT seen on physical exam. Negative Homan's sign. No cords or calf tenderness. No significant calf/ankle edema.  Recent Labs    10/18/17 0313 10/19/17 0550  HGB 13.9 10.5*  HCT 40.4 30.4*    Assessment/Plan: Plan for discharge tomorrow, Breastfeeding and Contraception lactation amenorrhea   LOS: 1 day   Jill Aguilar 10/19/2017, 8:02 AM

## 2017-10-20 LAB — RPR: RPR Ser Ql: NONREACTIVE

## 2017-10-20 MED ORDER — IBUPROFEN 600 MG PO TABS: 600.0000 mg | ORAL_TABLET | Freq: Four times a day (QID) | ORAL | 0 refills | Status: DC | PRN

## 2017-10-20 NOTE — Discharge Instructions (Signed)
Postpartum Care After Vaginal Delivery ° °The period of time right after you deliver your newborn is called the postpartum period. °What kind of medical care will I receive? °· You may continue to receive fluids and medicines through an IV tube inserted into one of your veins. °· If an incision was made near your vagina (episiotomy) or if you had some vaginal tearing during delivery, cold compresses may be placed on your episiotomy or your tear. This helps to reduce pain and swelling. °· You may be given a squirt bottle to use when you go to the bathroom. You may use this until you are comfortable wiping as usual. To use the squirt bottle, follow these steps: °? Before you urinate, fill the squirt bottle with warm water. Do not use hot water. °? After you urinate, while you are sitting on the toilet, use the squirt bottle to rinse the area around your urethra and vaginal opening. This rinses away any urine and blood. °? You may do this instead of wiping. As you start healing, you may use the squirt bottle before wiping yourself. Make sure to wipe gently. °? Fill the squirt bottle with clean water every time you use the bathroom. °· You will be given sanitary pads to wear. °How can I expect to feel? °· You may not feel the need to urinate for several hours after delivery. °· You will have some soreness and pain in your abdomen and vagina. °· If you are breastfeeding, you may have uterine contractions every time you breastfeed for up to several weeks postpartum. Uterine contractions help your uterus return to its normal size. °· It is normal to have vaginal bleeding (lochia) after delivery. The amount and appearance of lochia is often similar to a menstrual period in the first week after delivery. It will gradually decrease over the next few weeks to a dry, yellow-brown discharge. For most women, lochia stops completely by 6-8 weeks after delivery. Vaginal bleeding can vary from woman to woman. °· Within the first few  days after delivery, you may have breast engorgement. This is when your breasts feel heavy, full, and uncomfortable. Your breasts may also throb and feel hard, tightly stretched, warm, and tender. After this occurs, you may have milk leaking from your breasts. Your health care provider can help you relieve discomfort due to breast engorgement. Breast engorgement should go away within a few days. °· You may feel more sad or worried than normal due to hormonal changes after delivery. These feelings should not last more than a few days. If these feelings do not go away after several days, speak with your health care provider. °How should I care for myself? °· Tell your health care provider if you have pain or discomfort. °· Drink enough water to keep your urine clear or pale yellow. °· Wash your hands thoroughly with soap and water for at least 20 seconds after changing your sanitary pads, after using the toilet, and before holding or feeding your baby. °· If you are not breastfeeding, avoid touching your breasts a lot. Doing this can make your breasts produce more milk. °· If you become weak or lightheaded, or you feel like you might faint, ask for help before: °? Getting out of bed. °? Showering. °· Change your sanitary pads frequently. Watch for any changes in your flow, such as a sudden increase in volume, a change in color, the passing of large blood clots. If you pass a blood clot from your vagina,   save it to show to your health care provider. Do not flush blood clots down the toilet without having your health care provider look at them. °· Make sure that all your vaccinations are up to date. This can help protect you and your baby from getting certain diseases. You may need to have immunizations done before you leave the hospital. °· If desired, talk with your health care provider about methods of family planning or birth control (contraception). °How can I start bonding with my baby? °Spending as much time as  possible with your baby is very important. During this time, you and your baby can get to know each other and develop a bond. Having your baby stay with you in your room (rooming in) can give you time to get to know your baby. Rooming in can also help you become comfortable caring for your baby. Breastfeeding can also help you bond with your baby. °How can I plan for returning home with my baby? °· Make sure that you have a car seat installed in your vehicle. °? Your car seat should be checked by a certified car seat installer to make sure that it is installed safely. °? Make sure that your baby fits into the car seat safely. °· Ask your health care provider any questions you have about caring for yourself or your baby. Make sure that you are able to contact your health care provider with any questions after leaving the hospital. °This information is not intended to replace advice given to you by your health care provider. Make sure you discuss any questions you have with your health care provider. °Document Released: 01/29/2007 Document Revised: 09/06/2015 Document Reviewed: 03/08/2015 °Elsevier Interactive Patient Education © 2018 [employer] ° ° °Postpartum Depression and Baby Blues °The postpartum period begins right after the birth of a baby. During this time, there is often a great amount of joy and excitement. It is also a time of many changes in the life of the parents. Regardless of how many times a mother gives birth, each child brings new challenges and dynamics to the family. It is not unusual to have feelings of excitement along with confusing shifts in moods, emotions, and thoughts. All mothers are at risk of developing postpartum depression or the "baby blues." These mood changes can occur right after giving birth, or they may occur many months after giving birth. The baby blues or postpartum depression can be mild or severe. Additionally, postpartum depression can go away rather quickly, or it can  be a long-term condition. °What are the causes? °Raised hormone levels and the rapid drop in those levels are thought to be a main cause of postpartum depression and the baby blues. A number of hormones change during and after pregnancy. Estrogen and progesterone usually decrease right after the delivery of your baby. The levels of thyroid hormone and various cortisol steroids also rapidly drop. Other factors that play a role in these mood changes include major life events and genetics. °What increases the risk? °If you have any of the following risks for the baby blues or postpartum depression, know what symptoms to watch out for during the postpartum period. Risk factors that may increase the likelihood of getting the baby blues or postpartum depression include: °· Having a personal or family history of depression. °· Having depression while being pregnant. °· Having premenstrual mood issues or mood issues related to oral contraceptives. °· Having a lot of life stress. °· Having marital conflict. °· Lacking   a social support network. °· Having a baby with special needs. °· Having health problems, such as diabetes. ° °What are the signs or symptoms? °Symptoms of baby blues include: °· Brief changes in mood, such as going from extreme happiness to sadness. °· Decreased concentration. °· Difficulty sleeping. °· Crying spells, tearfulness. °· Irritability. °· Anxiety. ° °Symptoms of postpartum depression typically begin within the first month after giving birth. These symptoms include: °· Difficulty sleeping or excessive sleepiness. °· Marked weight loss. °· Agitation. °· Feelings of worthlessness. °· Lack of interest in activity or food. ° °Postpartum psychosis is a very serious condition and can be dangerous. Fortunately, it is rare. Displaying any of the following symptoms is cause for immediate medical attention. Symptoms of postpartum psychosis include: °· Hallucinations and delusions. °· Bizarre or disorganized  behavior. °· Confusion or disorientation. ° °How is this diagnosed? °A diagnosis is made by an evaluation of your symptoms. There are no medical or lab tests that lead to a diagnosis, but there are various questionnaires that a health care provider may use to identify those with the baby blues, postpartum depression, or psychosis. Often, a screening tool called the Edinburgh Postnatal Depression Scale is used to diagnose depression in the postpartum period. °How is this treated? °The baby blues usually goes away on its own in 1-2 weeks. Social support is often all that is needed. You will be encouraged to get adequate sleep and rest. Occasionally, you may be given medicines to help you sleep. °Postpartum depression requires treatment because it can last several months or longer if it is not treated. Treatment may include individual or group therapy, medicine, or both to address any social, physiological, and psychological factors that may play a role in the depression. Regular exercise, a healthy diet, rest, and social support may also be strongly recommended. °Postpartum psychosis is more serious and needs treatment right away. Hospitalization is often needed. °Follow these instructions at home: °· Get as much rest as you can. Nap when the baby sleeps. °· Exercise regularly. Some women find yoga and walking to be beneficial. °· Eat a balanced and nourishing diet. °· Do little things that you enjoy. Have a cup of tea, take a bubble bath, read your favorite magazine, or listen to your favorite music. °· Avoid alcohol. °· Ask for help with household chores, cooking, grocery shopping, or running errands as needed. Do not try to do everything. °· Talk to people close to you about how you are feeling. Get support from your partner, family members, friends, or other new moms. °· Try to stay positive in how you think. Think about the things you are grateful for. °· Do not spend a lot of time alone. °· Only take  over-the-counter or prescription medicine as directed by your health care provider. °· Keep all your postpartum appointments. °· Let your health care provider know if you have any concerns. °Contact a health care provider if: °You are having a reaction to or problems with your medicine. °Get help right away if: °· You have suicidal feelings. °· You think you may harm the baby or someone else. °This information is not intended to replace advice given to you by your health care provider. Make sure you discuss any questions you have with your health care provider. °Document Released: 01/06/2004 Document Revised: 09/09/2015 Document Reviewed: 01/13/2013 °Elsevier Interactive Patient Education © 2017 [employer]. ° ° °

## 2017-10-20 NOTE — Lactation Note (Addendum)
This note was copied from a baby's chart. Lactation Consultation Note: Mother reports that infant fed for 45 mins. She also reports that she saw milk in the nipple shield for the first time.  Mother reports that her nipples are sore. Mother has been using #20 NS which fits tightly. Observed that Rt nipple is red with a tiny blister. Mother is wearing inverted nipple shells.  Assist mother with hand expression and observed drops of milk from both breast. Encouraged mother to hand express before and after feedings and pumping. Mother reports that she attempt to latch infant on the bare breast this am but infant refused. Advised mother to do frequent skin to skin.   Mother was fit with a #24 nipple shield. Mothers left nipple is flat and larger than rt. Mother advised to page to have latch observed with next feeding.  Staff nurse sat up DEBP for mother. I assist with pumping the first time. Mother was fit with  #27 flanges. Mother was advised to give infant any amt of ebm that she pump. Mother obtained a few drops of milk.  Encouraged mother to post pump at least every 2-3 hours. Discussed the use of the nipple shield as a barrier.  Mother has an electric Even Flow pump at home. She has a harmony hand pump at the bedside.   [facility] to be observed.  Patient Name: Jill Aguilar YNWGN'FToday's Date: 10/20/2017 Reason for consult: Follow-up assessment   Maternal Data    Feeding Feeding Type: Breast Fed Length of feed: 45 min(per mom)  LATCH Score Latch: Repeated attempts needed to sustain latch, nipple held in mouth throughout feeding, stimulation needed to elicit sucking reflex.  Audible Swallowing: A few with stimulation  Type of Nipple: Everted at rest and after stimulation  Comfort (Breast/Nipple): Soft / non-tender  Hold (Positioning): Assistance needed to correctly position infant at breast and maintain latch.  LATCH Score:  7  Interventions Interventions: Breast feeding basics reviewed;Assisted with latch;Skin to skin;Breast massage;Adjust position;Comfort gels;Coconut oil  Lactation Tools Discussed/Used     Consult Status      Michel BickersKendrick, Deandrea Rion McCoy 10/20/2017, 10:00 AM

## 2017-10-20 NOTE — Discharge Summary (Signed)
OB Discharge Summary     Patient Name: Jill Aguilar DOB: 13-Apr-1991 MRN: [id number]  Date of admission: 10/18/2017 Delivering MD: Hoover BrownsKULWA, EMA   Date of discharge: 10/20/2017  Admitting diagnosis: 38wks, contractions Intrauterine pregnancy: 7673w2d     Secondary diagnosis:  Active Problems:   Normal labor     Discharge diagnosis: Term Pregnancy Delivered and VBAC                                                                                                Post partum procedures:n/a  Augmentation: n/a  Complications: None  Hospital course:  Onset of Labor With Vaginal Delivery     27 y.o. yo [id number] at 6973w2d was admitted in Active Labor on 10/18/2017. Patient had an uncomplicated labor course as follows:  Membrane Rupture Time/Date: 2:26 AM ,10/18/2017   Intrapartum Procedures: Episiotomy: None [1]                                         Lacerations:  2nd degree [3];Perineal [11]  Patient had a delivery of a Viable infant. 10/18/2017  Information for the patient's newborn:  Everlean PattersonLambert-Tucker, Girl [place] [[id number]][[id number]]  Delivery Method: VBAC, Vacuum Assisted(Filed from Delivery Summary)    Pateint had an uncomplicated postpartum course.  She is ambulating, tolerating a regular diet, passing flatus, and urinating well. Patient is discharged home in stable condition on 10/20/17.   Physical exam  Vitals:   10/19/17 0900 10/19/17 1447 10/19/17 2301 10/20/17 0545  BP: 109/72 122/81 130/81 117/70  Pulse: 82 80 85 72  Resp: 18 18  16   Temp: 97.8 F (36.6 C)  98.2 F (36.8 C) 97.7 F (36.5 C)  TempSrc: Oral  Oral Oral  SpO2:      Weight:      Height:       General: alert, cooperative and no distress Lochia: appropriate Uterine Fundus: firm Incision: N/A DVT Evaluation: No evidence of DVT seen on physical exam. Negative Homan's sign. No cords or calf tenderness. No significant calf/ankle edema. Labs: Lab Results  Component Value Date   WBC 10.1 10/19/2017   HGB  10.5 (L) 10/19/2017   HCT 30.4 (L) 10/19/2017   MCV 88.9 10/19/2017   PLT 185 10/19/2017   CMP Latest Ref Rng & Units 06/28/2017  Glucose 65 - 99 mg/dL 80  BUN 6 - 20 mg/dL 5(L)  Creatinine 1.300.44 - 1.00 mg/dL 8.650.47  Sodium 784135 - 696145 mmol/L 135  Potassium 3.5 - 5.1 mmol/L 3.5  Chloride 101 - 111 mmol/L 105  CO2 22 - 32 mmol/L 20(L)  Calcium 8.9 - 10.3 mg/dL 9.2  Total Protein 6.5 - 8.1 g/dL 6.7  Total Bilirubin 0.3 - 1.2 mg/dL 0.7  Alkaline Phos 38 - 126 U/L 73  AST 15 - 41 U/L 37  ALT 14 - 54 U/L 63(H)    Discharge instruction: per After Visit Summary and "Baby and Me Booklet".  After visit meds:  Allergies as of 10/20/2017  Reactions   Latex Itching   Pt itching post op where EKG leads, BP cuff, and tape touched her skin      Medication List    TAKE these medications   ibuprofen 600 MG tablet Commonly known as:  ADVIL,MOTRIN Take 1 tablet (600 mg total) by mouth every 6 (six) hours as needed for mild pain, moderate pain or cramping.   prenatal multivitamin Tabs tablet Take 1 tablet by mouth daily at 12 noon.       Diet: routine diet  Activity: Advance as tolerated. Pelvic rest for 6 weeks.   Outpatient follow up:6 weeks Follow up Appt:No future appointments. Follow up Visit:No follow-ups on file.  Postpartum contraception: Natural Family Planning  Newborn Data: Live born female  Birth Weight: 6 lb 8.9 oz (2974 g) APGAR: 8, 9  Newborn Delivery   Birth date/time:  10/18/2017 04:02:00 Delivery type:  VBAC, Vacuum Assisted     Baby Feeding: Breast Disposition:home with mother   10/20/2017 Janeece Riggers, CNM

## 2017-10-20 NOTE — Lactation Note (Signed)
This note was copied from a baby's chart. Lactation Consultation Note: [facility] She reports that infant fed for 17 mins with #24 nipple shield. Mother reports that she saw milk in the shield. She denies having any discomfort with latch.  Advised mother to continue to pump every 2-3 hours for 15-20 mins.  Discussed the use of additional calories by using formula if unable to express colostrum by hand or pump. Mother reports that she will page staff nurse if she decides to offer formula.  Discussed methods of giving supplement without using a bottle nipple.   Patient Name: Jill Aguilar OZHYQ'MToday's Date: 10/20/2017 Reason for consult: Follow-up assessment   Maternal Data    Feeding Feeding Type: Breast Fed Length of feed: 15 min  LATCH Score                   Interventions    Lactation Tools Discussed/Used Tools: Nipple Shields Nipple shield size: 24   Consult Status Consult Status: Follow-up Date: 10/20/17 Follow-up type: In-patient    Stevan BornKendrick, Garrett Bowring [facility] 10/20/2017, 2:16 PM

## 2017-10-21 ENCOUNTER — Ambulatory Visit: Payer: Self-pay

## 2017-10-21 NOTE — Lactation Note (Signed)
This note was copied from a baby's chart. Lactation Consultation Note  Patient Name: Jill Aguilar WUJWJ'XToday's Date: 10/21/2017 Reason for consult: Follow-up assessment;Infant weight loss;Infant < 6lbs;Difficult latch  Visited with P2 Mom of ET baby at 5477 hrs old.  Baby at 10% weight loss, and now <6 lbs.  Mom using a nipple shield to latch baby, and she has been consistently pumping.  Breasts are filling, Mom expressed 8 ml last pumping.  Output, 1 stool since birth, and 2 voids last 24 hrs.  Mom states baby is sleepy at the breast, latches and just sits there.  Initiated supplementation by paced bottle.  Will change to SNS at the breast at next feeding, RN aware.  Baby took 8 ml of EBM, and then 20 ml of formula well.  Encouraged Mom to unwrap and undress baby to keep her STS on her chest between feedings.  Recommended she do some breast massage and hand expression prior to pumping.  Mom obtaining most EBM from right breast, where she is latching baby more.  Mom to try to latch baby onto left side more, and ask for help prn.  [facility].  Mom has medicaid pending, and [facility]  Has an Evenflow DEBP at home.  Talked about the benefits of having a hospital grade pump to help establish her milk supply.  Plan- 1- offer breast every 3 hrs or sooner if baby cues to eat 2- offer 30 ml of EBM+/formula by slow flow bottle/SNS at breast 3-Pump both breasts 15-20 mins.  Use breast massage and hand expression 4- ASK FOR ASSISTANCE WITH LATCH PRN  Interventions Interventions: Breast feeding basics reviewed;Skin to skin;Breast massage;Hand express;Pre-pump if needed;Expressed milk;DEBP  Lactation Tools Discussed/Used Tools: Pump;Nipple Dorris CarnesShields;Bottle Nipple shield size: 24 Shell Type: Inverted Breast pump type: Double-Electric Breast Pump   Consult Status Consult Status: Follow-up Date: 10/22/17 Follow-up type: In-patient    Judee ClaraSmith,  Virdia Ziesmer E 10/21/2017, 9:11 AM

## 2017-10-21 NOTE — Lactation Note (Signed)
This note was copied from a baby's chart. Lactation Consultation Note  Patient Name: Jill Aguilar ZOXWR'UToday's Date: 10/21/2017 Reason for consult: Follow-up assessment  Jaundice bili-13.  Assisted with SNS at the breast using 5 fr feeding tube and syringe.  Baby sucking vigorously at the breast with a 24 mm nipple shield, colostrum noted in shield.  Baby popping off and getting fussy.   Initiated supplement at the breast using formula.  Baby took 8 ml by SNS before becoming too tired to continue. FOB to pace bottle feed the remainder amount, with goal of 30 ml total.  Parents assisted with how to stimulate baby's suck and support baby well during feeding. The remainder 15 ml fed by paced bottle feeding, assisting FOB on how to do this. Mom pumped and obtained 5-10 ml of EBM, and then placed baby STS on her chest.    Feeding Feeding Type: Formula Nipple Type: Slow - flow Length of feed: 15 min  LATCH Score Latch: Grasps breast easily, tongue down, lips flanged, rhythmical sucking.  Audible Swallowing: Spontaneous and intermittent(with formula supplementation)  Type of Nipple: Flat  Comfort (Breast/Nipple): Soft / non-tender  Hold (Positioning): Assistance needed to correctly position infant at breast and maintain latch.  LATCH Score: 8  Interventions Interventions: Breast feeding basics reviewed;Assisted with latch;Skin to skin;Breast massage;Hand express;Pre-pump if needed;Breast compression;Adjust position;Support pillows;Position options;Expressed milk;Shells;Hand pump;DEBP  Lactation Tools Discussed/Used Tools: Shells;Pump;Nipple Shields;62F feeding tube / Syringe Nipple shield size: 24 Shell Type: Inverted Breast pump type: Double-Electric Breast Pump   Consult Status Consult Status: Follow-up Date: 10/22/17 Follow-up type: In-patient    Judee ClaraSmith, Neville Pauls E 10/21/2017, 12:48 PM

## 2017-10-22 ENCOUNTER — Ambulatory Visit: Payer: Self-pay

## 2017-10-22 NOTE — Lactation Note (Addendum)
This note was copied from a baby's chart. Lactation Consultation Note  Patient Name: Jill Aguilar: 10/22/2017 Reason for consult: Follow-up assessment   Baby 614 days old.  Weight stabilized. 1 stool on 7/5 per mother 0100. Infant had meconium stool post barium.  Mother has been post pumping between 22-30 ml. Baby latched w/ #24NS.  Prefilled w/ breastmilk. Helped w/ pillows to bring baby to nipple height. Sucks and swallows observed.  Helped mother guide baby deep on breast.   Mother is currently supplementing w/ 30 ml.  Suggest increasing per day of life in 5-10 ml increments and as baby desires. Mom encouraged to feed baby 8-12 times/24 hours and with feeding cues.  Reviewed engorgement care and monitoring voids/stools. Mother has evenflow breast pump and will visit w/ WIC for DEBP. Mother has follow up lactation OP Barb Carder.    Maternal Data Has patient been taught Hand Expression?: Yes  Feeding Feeding Type: Breast Fed Length of feed: 60 min(off & on)  LATCH Score Latch: Grasps breast easily, tongue down, lips flanged, rhythmical sucking.  Audible Swallowing: A few with stimulation  Type of Nipple: Everted at rest and after stimulation  Comfort (Breast/Nipple): Soft / non-tender  Hold (Positioning): Assistance needed to correctly position infant at breast and maintain latch.  LATCH Score: 8  Interventions Interventions: Breast feeding basics reviewed;Assisted with latch;Hand express  Lactation Tools Discussed/Used Tools: 27F feeding tube / Syringe;Nipple Shields Nipple shield size: 24 Breast pump type: Double-Electric Breast Pump   Consult Status Consult Status: Complete Aguilar: 10/22/17    Dahlia ByesBerkelhammer, Ronasia Isola [facility] 10/22/2017, 12:38 PM

## 2017-10-24 ENCOUNTER — Inpatient Hospital Stay (HOSPITAL_COMMUNITY): Admission: RE | Admit: 2017-10-24 | Payer: Medicaid Other | Source: Ambulatory Visit

## 2017-10-24 ENCOUNTER — Encounter (HOSPITAL_COMMUNITY): Payer: Self-pay | Admitting: *Deleted

## 2017-10-24 ENCOUNTER — Inpatient Hospital Stay (HOSPITAL_COMMUNITY)
Admission: AD | Admit: 2017-10-24 | Discharge: 2017-10-24 | Disposition: A | Discharge status: Home / Self Care | Payer: Medicaid Other | Source: Ambulatory Visit | Attending: Obstetrics and Gynecology | Admitting: Obstetrics and Gynecology

## 2017-10-24 DIAGNOSIS — R51 Headache: Secondary | ICD-10-CM | POA: Diagnosis present

## 2017-10-24 DIAGNOSIS — O26899 Other specified pregnancy related conditions, unspecified trimester: Secondary | ICD-10-CM

## 2017-10-24 DIAGNOSIS — O9089 Other complications of the puerperium, not elsewhere classified: Secondary | ICD-10-CM | POA: Diagnosis present

## 2017-10-24 LAB — CBC
HEMATOCRIT: 34.2 % — AB (ref 36.0–46.0)
Hemoglobin: 11.2 g/dL — ABNORMAL LOW (ref 12.0–15.0)
MCH: 29.9 pg (ref 26.0–34.0)
MCHC: 32.7 g/dL (ref 30.0–36.0)
MCV: 91.2 fL (ref 78.0–100.0)
Platelets: 251 10*3/uL (ref 150–400)
RBC: 3.75 MIL/uL — ABNORMAL LOW (ref 3.87–5.11)
RDW: 13 % (ref 11.5–15.5)
WBC: 9 10*3/uL (ref 4.0–10.5)

## 2017-10-24 LAB — PROTEIN / CREATININE RATIO, URINE: CREATININE, URINE: 55 mg/dL

## 2017-10-24 LAB — COMPREHENSIVE METABOLIC PANEL
ALK PHOS: 114 U/L (ref 38–126)
ALT: 21 U/L (ref 0–44)
ANION GAP: 9 (ref 5–15)
AST: 22 U/L (ref 15–41)
Albumin: 2.9 g/dL — ABNORMAL LOW (ref 3.5–5.0)
BILIRUBIN TOTAL: 0.6 mg/dL (ref 0.3–1.2)
BUN: 13 mg/dL (ref 6–20)
CALCIUM: 8.6 mg/dL — AB (ref 8.9–10.3)
CO2: 23 mmol/L (ref 22–32)
Chloride: 106 mmol/L (ref 98–111)
Creatinine, Ser: 0.72 mg/dL (ref 0.44–1.00)
GFR calc Af Amer: >60 mL/min (ref >60)
Glucose, Bld: 88 mg/dL (ref 70–99)
POTASSIUM: 3.9 mmol/L (ref 3.5–5.1)
Sodium: 138 mmol/L (ref 135–145)
TOTAL PROTEIN: 6 g/dL — AB (ref 6.5–8.1)

## 2017-10-24 MED ORDER — IBUPROFEN 600 MG PO TABS
600.0000 mg | ORAL_TABLET | Freq: Once | ORAL | Status: AC
  Administered 2017-10-24: 600 mg via ORAL
  Filled 2017-10-24: qty 1

## 2017-10-24 NOTE — MAU Provider Note (Signed)
Chief Complaint: Postpartum Complications and Hypertension   None    SUBJECTIVE HPI: Jill Aguilar' Fransisca Connors is a 27 y.o. G2P2002 at Unknown who presents to Maternity Admissions reporting headache x 2 days and BP increased today.  Has not taken anything for headache.  Infant was in NICU till [facility].  Pt not eating or sleeping much.  No rest today.  Location: Headache Quality: mild Severity:4/10 on pain scale Duration: 2 to 3 days   Past Medical History:  Diagnosis Date  . Anxiety   . Depression   . Heart murmur    childhood  . Hx of chlamydia infection   . Nausea   . Pregnancy 15 weeks as of 05-07-17  . Vitamin D deficiency    OB History  Gravida Para Term Preterm AB Living  2 2 2  0 0 2  SAB TAB Ectopic Multiple Live Births  0 0 0 0 2    # Outcome Date GA Lbr Len/2nd Weight Sex Delivery Anes PTL Lv  2 Term 10/18/17 [redacted]w[redacted]d 01:24 / 00:12 2.974 kg (6 lb 8.9 oz) F VBAC, Vacuum Local  LIV  1 Term 08/19/08 [redacted]w[redacted]d  2.693 kg (5 lb 15 oz) F CS-LTranv Spinal Y LIV     Complications: Fetal Intolerance   Past Surgical History:  Procedure Laterality Date  . CESAREAN SECTION     fetal intolerance  . LAPAROSCOPIC CHOLECYSTECTOMY SINGLE PORT N/A 05/17/2017   Procedure: LAPAROSCOPIC CHOLECYSTECTOMY SINGLE SITE;  Surgeon: Karie Soda, MD;  Location: [facility]S;  Service: General;  Laterality: N/A;  . STERIOD INJECTION  05/17/2017   Procedure: STEROID INJECTION OF INCISION WITH KENALOG 40MG ;  Surgeon: Karie Soda, MD;  Location: [facility] ORS;  Service: General;;  . WISDOM TOOTH EXTRACTION Bilateral 10/2002   Social History   Socioeconomic History  . Marital status: Married    Spouse name: Not on file  . Number of children: Not on file  . Years of education: Not on file  . Highest education level: Not on file  Occupational History  . Not on file  Social Needs  . Financial resource strain: Not on file  . Food insecurity:    Worry: Not on file    Inability: Not on file  .  Transportation needs:    Medical: Not on file    Non-medical: Not on file  Tobacco Use  . Smoking status: Never Smoker  . Smokeless tobacco: Never Used  Substance and Sexual Activity  . Alcohol use: No    Alcohol/week: 0.0 oz    Frequency: Never    Comment: none since pregnancy  . Drug use: Not Currently    Frequency: 1.0 times per week    Types: Marijuana    Comment: none since pregnancy  . Sexual activity: Not Currently    Birth control/protection: None  Lifestyle  . Physical activity:    Days per week: Not on file    Minutes per session: Not on file  . Stress: Not on file  Relationships  . Social connections:    Talks on phone: Not on file    Gets together: Not on file    Attends religious service: Not on file    Active member of club or organization: Not on file    Attends meetings of clubs or organizations: Not on file    Relationship status: Not on file  . Intimate partner violence:    Fear of current or ex partner: Not on file    Emotionally abused:  Not on file    Physically abused: Not on file    Forced sexual activity: Not on file  Other Topics Concern  . Not on file  Social History Narrative   ** Merged History Encounter **       Family History  Problem Relation Age of Onset  . Arthritis Mother   . Asthma Mother   . Asthma Brother   . Birth defects Brother   . Cancer Maternal Grandmother   . Diabetes Maternal Grandmother   . Hyperlipidemia Maternal Grandmother   . Hypertension Maternal Grandmother   . Arthritis Maternal Grandfather    No current facility-administered medications on file prior to encounter.    Current Outpatient Medications on File Prior to Encounter  Medication Sig Dispense Refill  . ibuprofen (ADVIL,MOTRIN) 600 MG tablet Take 1 tablet (600 mg total) by mouth every 6 (six) hours as needed for mild pain, moderate pain or cramping. 30 tablet 0  . Prenatal Vit-Fe Fumarate-FA (PRENATAL MULTIVITAMIN) TABS tablet Take 1 tablet by mouth  daily at 12 noon.     Allergies  Allergen Reactions  . Latex Itching    Pt itching post op where EKG leads, BP cuff, and tape touched her skin    I have reviewed patient's Past Medical Hx, Surgical Hx, Family Hx, Social Hx, medications and allergies.   Review of Systems  Constitutional: Negative.   HENT: Negative.   Eyes: Negative.   Respiratory: Negative.   Cardiovascular: Negative.   Gastrointestinal: Positive for abdominal pain.  Endocrine: Negative.   Genitourinary: Positive for vaginal bleeding.  Musculoskeletal: Negative.   Skin: Positive for wound.       LTCS incision healing  Allergic/Immunologic: Negative.   Neurological: Positive for headaches.  Hematological: Negative.   Psychiatric/Behavioral: Negative.     OBJECTIVE Patient Vitals for the past 24 hrs:  BP Temp Temp src Pulse Resp SpO2 Height Weight  10/24/17 2226 - (!) 97.2 F (36.2 C) Oral - - - - -  10/24/17 2220 (!) 142/82 - - 65 - - - -  10/24/17 2200 (!) 148/84 - - (!) 52 - - - -  10/24/17 2140 (!) 153/89 - - (!) 51 - - - -  10/24/17 2120 138/85 - - 74 - - - -  10/24/17 2100 136/85 - - (!) 54 - - - -  10/24/17 2040 137/88 - - 71 - - - -  10/24/17 2020 127/84 - - 67 - - - -  10/24/17 2011 134/78 - - (!) 57 - - - -  10/24/17 1956 (!) 154/97 98.3 F (36.8 C) Oral 74 20 100 % 5\' 7"  (1.702 m) 80.3 kg (177 lb)   Constitutional: Well-developed, well-nourished female in no acute distress.  Cardiovascular: normal rate Respiratory: normal rate and effort.  GI: Abd soft, non-tender, gravid appropriate for gestational age. Pos BS x 4  Incision healing MS: Extremities nontender, slightcvedema, normal ROM +2/+2 reflexes Neurologic: Alert and oriented x 4.  GU: Neg CVAT.   LAB RESULTS Results for orders placed or performed during the hospital encounter of 10/24/17 (from the past 24 hour(s))  CBC     Status: Abnormal   Collection Time: 10/24/17  8:41 PM  Result Value Ref Range   WBC 9.0 4.0 - 10.5 K/uL    RBC 3.75 (L) 3.87 - 5.11 MIL/uL   Hemoglobin 11.2 (L) 12.0 - 15.0 g/dL   HCT 16.134.2 (L) 09.636.0 - 04.546.0 %   MCV 91.2 78.0 - 100.0 fL  MCH 29.9 26.0 - 34.0 pg   MCHC 32.7 30.0 - 36.0 g/dL   RDW 56.2 13.0 - 86.5 %   Platelets 251 150 - 400 K/uL  Comprehensive metabolic panel     Status: Abnormal   Collection Time: 10/24/17  8:41 PM  Result Value Ref Range   Sodium 138 135 - 145 mmol/L   Potassium 3.9 3.5 - 5.1 mmol/L   Chloride 106 98 - 111 mmol/L   CO2 23 22 - 32 mmol/L   Glucose, Bld 88 70 - 99 mg/dL   BUN 13 6 - 20 mg/dL   Creatinine, Ser 7.84 0.44 - 1.00 mg/dL   Calcium 8.6 (L) 8.9 - 10.3 mg/dL   Total Protein 6.0 (L) 6.5 - 8.1 g/dL   Albumin 2.9 (L) 3.5 - 5.0 g/dL   AST 22 15 - 41 U/L   ALT 21 0 - 44 U/L   Alkaline Phosphatase 114 38 - 126 U/L   Total Bilirubin 0.6 0.3 - 1.2 mg/dL   GFR calc non Af Amer >60 >60 mL/min   GFR calc Af Amer >60 >60 mL/min   Anion gap 9 5 - 15  Protein / creatinine ratio, urine     Status: None   Collection Time: 10/24/17  8:49 PM  Result Value Ref Range   Creatinine, Urine 55.00 mg/dL   Total Protein, Urine <6.00 mg/dL   Protein Creatinine Ratio        0.00 - 0.15 mg/mg[Cre]    IMAGING No results found.  MAU COURSE Orders Placed This Encounter  Procedures  . CBC  . Comprehensive metabolic panel  . Protein / creatinine ratio, urine  . In and Out Cath  . Discharge patient Discharge disposition: 01-Home or Self Care; Discharge patient date: 10/24/2017   Meds ordered this encounter  Medications  . ibuprofen (ADVIL,MOTRIN) tablet 600 mg    MDM PE done, Labs drawn and negative.  Motrin given for headache.   ASSESSMENT Postpartum headache   PLAN Discharge home in stable condition.  Discussed pain management for headache.  Rest and try to eat regular meals.  RTC for postpartum appointment.   Follow-up Information    [facility] Follow up in 2 week(s).   Specialty:  Obstetrics and Gynecology Contact  information: [address] [place] [address] [phone]         Allergies as of 10/24/2017      Reactions   Latex Itching   Pt itching post op where EKG leads, BP cuff, and tape touched her skin      Medication List    TAKE these medications   ibuprofen 600 MG tablet Commonly known as:  ADVIL,MOTRIN Take 1 tablet (600 mg total) by mouth every 6 (six) hours as needed for mild pain, moderate pain or cramping.   prenatal multivitamin Tabs tablet Take 1 tablet by mouth daily at 12 noon.        Kenney Houseman, CNM 10/24/2017  11:20 PM

## 2017-10-24 NOTE — MAU Note (Signed)
Pt delivered on 10/18/2017 SVD. Pt here for high blood pressure. Pt states she did not have elevated BP during pregnancy. States she has had a HA x3 days, seeing stars and swelling in feet. States she has not taken anything for HA. States BP at home was 144/101 and 177/118.

## 2017-10-30 ENCOUNTER — Inpatient Hospital Stay (HOSPITAL_COMMUNITY): Admit: 2017-10-30 | Payer: Self-pay

## 2019-08-05 IMAGING — DX DG CHEST 1V PORT
1 series · 1 of 1 positions shown · non-contrast
Comparison: None.

CLINICAL DATA: Chest and epigastric pain for 5 months.

EXAM:
PORTABLE CHEST 1 VIEW

[chest ap]
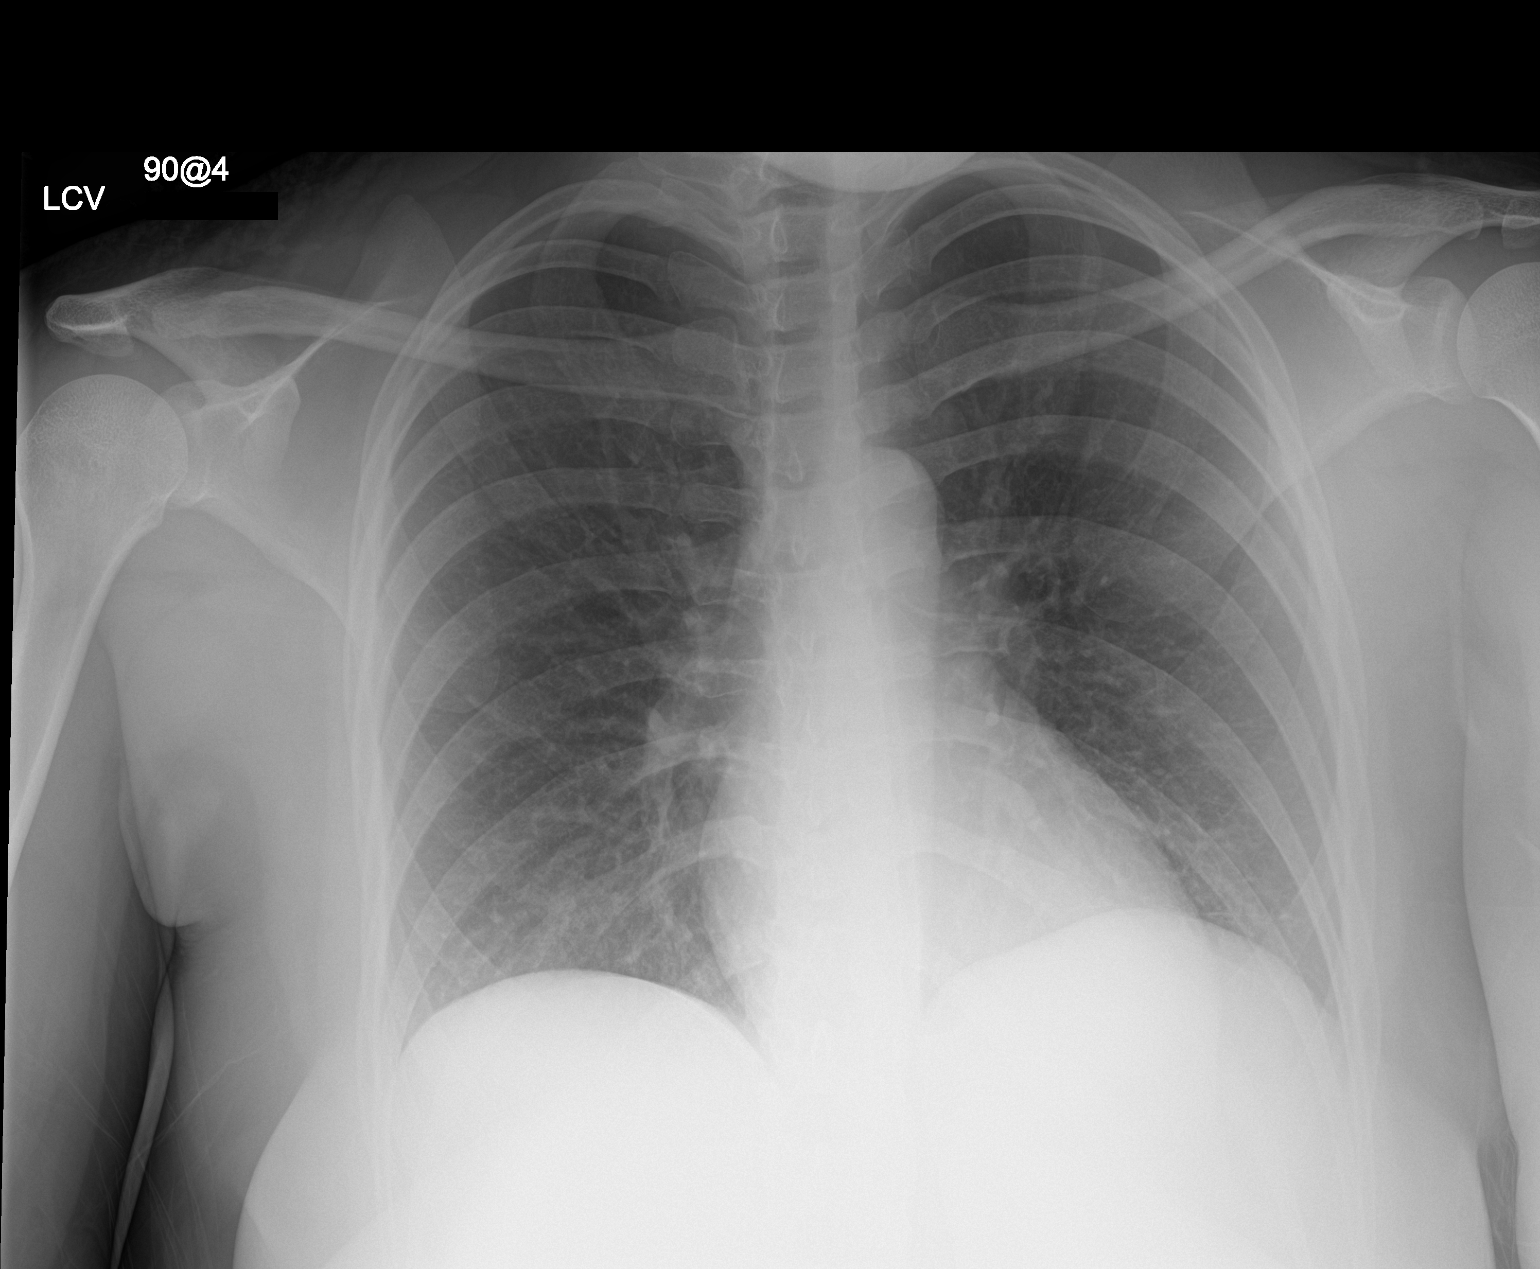

[1 of 1 positions shown; findings below may reference images not displayed]

FINDINGS: The heart size and mediastinal contours are within normal limits.
Both lungs are clear. No evidence of pneumothorax or pleural
effusion. The visualized skeletal structures are unremarkable.
IMPRESSION: No active disease.

## 2020-04-13 ENCOUNTER — Ambulatory Visit
Admission: EM | Admit: 2020-04-13 | Discharge: 2020-04-13 | Disposition: A | Discharge status: Home / Self Care | Payer: 59

## 2020-04-14 ENCOUNTER — Ambulatory Visit
Admission: EM | Admit: 2020-04-14 | Discharge: 2020-04-14 | Disposition: A | Discharge status: Home / Self Care | Payer: 59 | Attending: Family Medicine | Admitting: Family Medicine

## 2020-04-14 ENCOUNTER — Other Ambulatory Visit: Payer: Self-pay

## 2020-04-14 VITALS — BP 123/80 | HR 78 | Temp 98.1°F | Resp 18

## 2020-04-14 DIAGNOSIS — R197 Diarrhea, unspecified: Secondary | ICD-10-CM | POA: Diagnosis not present

## 2020-04-14 LAB — CBC WITH DIFFERENTIAL/PLATELET
Abs Immature Granulocytes: 0.01 10*3/uL (ref 0.00–0.07)
Basophils Absolute: 0.1 10*3/uL (ref 0.0–0.1)
Basophils Relative: 1 %
Eosinophils Absolute: 0.4 10*3/uL (ref 0.0–0.5)
Eosinophils Relative: 4 %
HCT: 44.4 % (ref 36.0–46.0)
Hemoglobin: 15.1 g/dL — ABNORMAL HIGH (ref 12.0–15.0)
Immature Granulocytes: 0 %
Lymphocytes Relative: 34 %
Lymphs Abs: 2.7 10*3/uL (ref 0.7–4.0)
MCH: 29.4 pg (ref 26.0–34.0)
MCHC: 34 g/dL (ref 30.0–36.0)
MCV: 86.4 fL (ref 80.0–100.0)
Monocytes Absolute: 0.8 10*3/uL (ref 0.1–1.0)
Monocytes Relative: 9 %
Neutro Abs: 4.3 10*3/uL (ref 1.7–7.7)
Neutrophils Relative %: 52 %
Platelets: 377 10*3/uL (ref 150–400)
RBC: 5.14 MIL/uL — ABNORMAL HIGH (ref 3.87–5.11)
RDW: 12.2 % (ref 11.5–15.5)
WBC: 8.2 10*3/uL (ref 4.0–10.5)
nRBC: 0 % (ref 0.0–0.2)

## 2020-04-14 LAB — WET PREP, GENITAL
Clue Cells Wet Prep HPF POC: NONE SEEN
Sperm: NONE SEEN
Trich, Wet Prep: NONE SEEN
Yeast Wet Prep HPF POC: NONE SEEN

## 2020-04-14 LAB — COMPREHENSIVE METABOLIC PANEL
ALT: 19 U/L (ref 0–44)
AST: 21 U/L (ref 15–41)
Albumin: 4.3 g/dL (ref 3.5–5.0)
Alkaline Phosphatase: 83 U/L (ref 38–126)
Anion gap: 10 (ref 5–15)
BUN: 10 mg/dL (ref 6–20)
CO2: 24 mmol/L (ref 22–32)
Calcium: 9.2 mg/dL (ref 8.9–10.3)
Chloride: 105 mmol/L (ref 98–111)
Creatinine, Ser: 0.84 mg/dL (ref 0.44–1.00)
GFR, Estimated: >60 mL/min (ref >60)
Glucose, Bld: 90 mg/dL (ref 70–99)
Potassium: 3.9 mmol/L (ref 3.5–5.1)
Sodium: 139 mmol/L (ref 135–145)
Total Bilirubin: 0.4 mg/dL (ref 0.3–1.2)
Total Protein: 7.8 g/dL (ref 6.5–8.1)

## 2020-04-14 LAB — CHLAMYDIA/NGC RT PCR (ARMC ONLY)
Chlamydia Tr: NOT DETECTED
N gonorrhoeae: NOT DETECTED

## 2020-04-14 LAB — LIPASE, BLOOD: Lipase: 22 U/L (ref 11–51)

## 2020-04-14 LAB — PREGNANCY, URINE: Preg Test, Ur: NEGATIVE

## 2020-04-14 MED ORDER — DIPHENOXYLATE-ATROPINE 2.5-0.025 MG PO TABS: 1.0000 | ORAL_TABLET | Freq: Four times a day (QID) | ORAL | 0 refills | Status: DC | PRN

## 2020-04-14 NOTE — Discharge Instructions (Addendum)
Medication as prescribed.  Lots of fluids.  Return with stool sample.   Avoid breastfeeding while using the medication.  Take care  Dr. Adriana Simas

## 2020-04-14 NOTE — ED Triage Notes (Signed)
Pt presents with constant watery diarrhea x approx 6 days.  Occurs every time she eats/drinks and has very foul odor. Is decreasing her milk supply.  Noticed streaks of blood in stool yesterday.  Diarrhea seems to have improved today. No abdominal pain.   Would also like STD testing and possibly Upreg.  No discharge or rash but did have unprotected intercourse 1-2 weeks ago.

## 2020-04-14 NOTE — ED Provider Notes (Signed)
[facility]    CSN: [id number] Arrival date & time: 04/14/20  1359      History   Chief Complaint Chief Complaint  Patient presents with  . Diarrhea  . Appointment   HPI  29 year old female presents with diarrhea.  Patient states that she has had ongoing diarrhea for the past 6 days.  No known inciting factor.  Denies any significant abdominal pain.  Initially was doing okay but has now developed bloody diarrhea.  She is able to eat and drink but states that everything "runs through her".  She is breast-feeding but has been unable to do so as her milk supply is dwindling in the setting of her illness.  No fever.  No changes in her diet.  Patient reports that she recently had unprotected intercourse and would like STD testing today as well.  She would like it urine pregnancy test as well.  No other complaints or concerns at  this time.  Past Medical History:  Diagnosis Date  . Anxiety   . Depression   . Heart murmur    childhood  . Hx of chlamydia infection   . Nausea   . Pregnancy 15 weeks as of 05-07-17  . Vitamin D deficiency     Patient Active Problem List   Diagnosis Date Noted  . Normal labor 10/18/2017  . Cholelithiasis affecting pregnancy in second trimester, antepartum 05/17/2017  . Nausea and vomiting 03/08/2017  . Nausea and vomiting in pregnancy 03/07/2017  . Symptomatic cholelithiasis 02/15/2017  . Chronic cholecystitis s/p lap cholecystectomy 05/17/2017   . 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 PTSD (post-traumatic stress disorder) 03/16/2015  . 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 CESAREAN SECTION     fetal intolerance  . LAPAROSCOPIC CHOLECYSTECTOMY SINGLE PORT N/A 05/17/2017   Procedure: LAPAROSCOPIC CHOLECYSTECTOMY SINGLE SITE;  Surgeon: Karie Soda, MD;  Location: [facility];  Service: General;  Laterality: N/A;  . STERIOD INJECTION  05/17/2017    Procedure: STEROID INJECTION OF INCISION WITH KENALOG 40MG ;  Surgeon: , MD;  Location: [facility];  Service: General;;  . WISDOM TOOTH EXTRACTION Bilateral 10/2002    OB History    Gravida  2   Para  2   Term  2   Preterm  0   AB  0   Living  2     SAB  0   IAB  0   Ectopic  0   Multiple  0   Live Births  2            Home Medications    Prior to Admission medications   Medication Sig Start Date End Date Taking? Authorizing Provider  diphenoxylate-atropine (LOMOTIL) 2.5-0.025 MG tablet Take 1 tablet by mouth 4 (four) times daily as needed for diarrhea or loose stools. 04/14/20  Yes Tristin Gladman G, DO  ibuprofen (ADVIL,MOTRIN) 600 MG tablet Take 1 tablet (600 mg total) by mouth every 6 (six) hours as needed for mild pain, moderate pain or cramping. 10/20/17  Yes 12/21/17, CNM    Family History Family History  Problem Relation Age of Onset  . Arthritis Mother   . Asthma Mother   . Asthma Brother   . Birth defects Brother   . Cancer Maternal Grandmother   . Diabetes Maternal Grandmother   . Hyperlipidemia Maternal Grandmother   . Hypertension Maternal Grandmother   . Arthritis Maternal Grandfather  Social History Social History   Tobacco Use  . Smoking status: Never Smoker  . Smokeless tobacco: Never Used  Vaping Use  . Vaping Use: Never used  Substance Use Topics  . Alcohol use: No    Alcohol/week: 0.0 standard drinks    Comment: none since pregnancy  . Drug use: Not Currently    Frequency: 1.0 times per week    Types: Marijuana    Comment: none since pregnancy     Allergies   Latex   Review of Systems Review of Systems  Constitutional: Positive for unexpected weight change.  Gastrointestinal: Positive for diarrhea.   Physical Exam Triage Vital Signs ED Triage Vitals  Enc Vitals Group     BP 04/14/20 1435 123/80     Pulse Rate 04/14/20 1435 78     Resp 04/14/20 1435 18     Temp 04/14/20 1435 98.1 F (36.7 C)      Temp Source 04/14/20 1435 Oral     SpO2 04/14/20 1435 96 %     Weight --      Height --      Head Circumference --      Peak Flow --      Pain Score 04/14/20 1427 0     Pain Loc --      Pain Edu? --      Excl. in GC? --    Updated Vital Signs BP 123/80 (BP Location: Left Arm)   Pulse 78   Temp 98.1 F (36.7 C) (Oral)   Resp 18   LMP 02/16/2020   SpO2 96%   Breastfeeding Yes   Visual Acuity Right Eye Distance:   Left Eye Distance:   Bilateral Distance:    Right Eye Near:   Left Eye Near:    Bilateral Near:     Physical Exam Vitals and nursing note reviewed.  Constitutional:      General: She is not in acute distress.    Appearance: Normal appearance. She is not ill-appearing.  HENT:     Head: Normocephalic and atraumatic.  Eyes:     General:        Right eye: No discharge.        Left eye: No discharge.     Conjunctiva/sclera: Conjunctivae normal.  Cardiovascular:     Rate and Rhythm: Normal rate and regular rhythm.  Pulmonary:     Effort: Pulmonary effort is normal.     Breath sounds: Normal breath sounds. No wheezing, rhonchi or rales.  Abdominal:     General: There is no distension.     Palpations: Abdomen is soft.     Tenderness: There is no abdominal tenderness.  Neurological:     Mental Status: She is alert.  Psychiatric:        Mood and Affect: Mood normal.        Behavior: Behavior normal.    UC Treatments / Results  Labs (all labs ordered are listed, but only abnormal results are displayed) Labs Reviewed  WET PREP, GENITAL - Abnormal; Notable for the following components:      Result Value   WBC, Wet Prep HPF POC FEW (*)    All other components within normal limits  CBC WITH DIFFERENTIAL/PLATELET - Abnormal; Notable for the following components:   RBC 5.14 (*)    Hemoglobin 15.1 (*)    All other components within normal limits  CHLAMYDIA/NGC RT PCR ([facility] ONLY)  PREGNANCY, URINE  COMPREHENSIVE METABOLIC PANEL  LIPASE, BLOOD  EKG   Radiology No results found.  Procedures Procedures (including critical care time)  Medications Ordered in UC Medications - No data to display  Initial Impression / Assessment and Plan / UC Course  I have reviewed the triage vital signs and the nursing notes.  Pertinent labs & imaging results that were available during my care of the patient were reviewed by me and considered in my medical decision making (see chart for details).    29 year old female presents with ongoing diarrhea.  Now experiencing bloody diarrhea.  Given the bloody diarrhea, she will return with stool sample for GI panel.  Lomotil as needed for diarrhea.  Laboratory studies reassuring.  Push fluids.  Supportive care.  Final Clinical Impressions(s) / UC Diagnoses   Final diagnoses:  Bloody diarrhea     Discharge Instructions     Medication as prescribed.  Lots of fluids.  Return with stool sample.   Avoid breastfeeding while using the medication.  Take care  Dr. Adriana Simas    ED Prescriptions    Medication Sig Dispense Auth. Provider   diphenoxylate-atropine (LOMOTIL) 2.5-0.025 MG tablet Take 1 tablet by mouth 4 (four) times daily as needed for diarrhea or loose stools. 30 tablet Tommie Sams, DO     PDMP not reviewed this encounter.   Tommie Sams, [place] 04/14/20 1849

## 2020-07-19 ENCOUNTER — Other Ambulatory Visit: Payer: Self-pay

## 2020-07-19 ENCOUNTER — Emergency Department
Admission: EM | Admit: 2020-07-19 | Discharge: 2020-07-20 | Disposition: A | Discharge status: Home / Self Care | Payer: 59 | Attending: Emergency Medicine | Admitting: Emergency Medicine

## 2020-07-19 ENCOUNTER — Emergency Department: Payer: 59

## 2020-07-19 DIAGNOSIS — R319 Hematuria, unspecified: Secondary | ICD-10-CM | POA: Insufficient documentation

## 2020-07-19 DIAGNOSIS — M545 Low back pain, unspecified: Secondary | ICD-10-CM | POA: Diagnosis present

## 2020-07-19 DIAGNOSIS — M5442 Lumbago with sciatica, left side: Secondary | ICD-10-CM | POA: Insufficient documentation

## 2020-07-19 DIAGNOSIS — M5440 Lumbago with sciatica, unspecified side: Secondary | ICD-10-CM

## 2020-07-19 LAB — POC URINE PREG, ED: Preg Test, Ur: NEGATIVE

## 2020-07-19 NOTE — ED Provider Notes (Signed)
[facility]  ____________________________________________  Time seen: Approximately 10:25 PM  I have reviewed the triage vital signs and the nursing notes.   HISTORY  Chief Complaint Back Pain   Historian Patient     HPI Jill Aguilar is a 30 y.o. female presents to the emergency department with left-sided low back pain that radiates into the left buttocks.  Patient states that her primary care provider noticed that patient has blood in her urine and she has not near the time of her menses.  Primary care provider anticipated getting a CT renal stone study to rule out nephrolithiasis.  Patient denies possibility of pregnancy stating that she has not been sexually active since January.  She denies dysuria, increased urinary frequency or frank hematuria at home.  She states that she has not experiences low back pain in the past.  No bowel or bladder incontinence or saddle anesthesia.    Past Medical History:  Diagnosis Date  . Anxiety   . Depression   . Heart murmur    childhood  . Hx of chlamydia infection   . Nausea   . Pregnancy 15 weeks as of 05-07-17  . Vitamin D deficiency      Immunizations up to date:  Yes.     Past Medical History:  Diagnosis Date  . Anxiety   . Depression   . Heart murmur    childhood  . Hx of chlamydia infection   . Nausea   . Pregnancy 15 weeks as of 05-07-17  . Vitamin D deficiency     Patient Active Problem List   Diagnosis Date Noted  . Normal labor 10/18/2017  . Cholelithiasis affecting pregnancy in second trimester, antepartum 05/17/2017  . Nausea and vomiting 03/08/2017  . Nausea and vomiting in pregnancy 03/07/2017  . Symptomatic cholelithiasis 02/15/2017  . Chronic cholecystitis s/p lap cholecystectomy 05/17/2017   . 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 PTSD (post-traumatic stress disorder) 03/16/2015  . ADHD (attention deficit hyperactivity disorder), combined type 03/16/2015    Past Surgical  History:  Procedure Laterality Date  . CESAREAN SECTION     fetal intolerance  . LAPAROSCOPIC CHOLECYSTECTOMY SINGLE PORT N/A 05/17/2017   Procedure: LAPAROSCOPIC CHOLECYSTECTOMY SINGLE SITE;  Surgeon: Karie Soda, MD;  Location: [facility];  Service: General;  Laterality: N/A;  . STERIOD INJECTION  05/17/2017   Procedure: STEROID INJECTION OF INCISION WITH KENALOG 40MG ;  Surgeon: , MD;  Location: [facility];  Service: General;;  . WISDOM TOOTH EXTRACTION Bilateral 10/2002    Prior to Admission medications   Medication Sig Start Date End Date Taking? Authorizing Provider  methocarbamol (ROBAXIN) 500 MG tablet Take 1 tablet (500 mg total) by mouth every 8 (eight) hours as needed for up to 5 days. 07/20/20 07/25/20 Yes 09/24/20 M, PA-C  predniSONE (STERAPRED UNI-PAK 21 TAB) 10 MG (21) TBPK tablet 6,5,4,3,2,1 07/20/20  Yes 09/19/20, Virdie Penning M, PA-C  diphenoxylate-atropine (LOMOTIL) 2.5-0.025 MG tablet Take 1 tablet by mouth 4 (four) times daily as needed for diarrhea or loose stools. 04/14/20   04/16/20, DO  ibuprofen (ADVIL,MOTRIN) 600 MG tablet Take 1 tablet (600 mg total) by mouth every 6 (six) hours as needed for mild pain, moderate pain or cramping. 10/20/17   12/21/17, CNM    Allergies Latex  Family History  Problem Relation Age of Onset  . Arthritis Mother   . Asthma Mother   . Asthma Brother   . Birth defects Brother   . Cancer Maternal  Grandmother   . Diabetes Maternal Grandmother   . Hyperlipidemia Maternal Grandmother   . Hypertension Maternal Grandmother   . Arthritis Maternal Grandfather     Social History Social History   Tobacco Use  . Smoking status: Never Smoker  . Smokeless tobacco: Never Used  Vaping Use  . Vaping Use: Never used  Substance Use Topics  . Alcohol use: Yes    Alcohol/week: 0.0 standard drinks    Comment: none since pregnancy  . Drug use: Not Currently    Frequency: 1.0 times per week    Types: Marijuana    Comment: none since  pregnancy     Review of Systems  Constitutional: No fever/chills Eyes:  No discharge ENT: No upper respiratory complaints. Respiratory: no cough. No SOB/ use of accessory muscles to breath Gastrointestinal:   No nausea, no vomiting.  No diarrhea.  No constipation. Musculoskeletal: Patient has low back pain.  Skin: Negative for rash, abrasions, lacerations, ecchymosis.    ____________________________________________   PHYSICAL EXAM:  VITAL SIGNS: ED Triage Vitals  Enc Vitals Group     BP 07/19/20 2028 (!) 134/109     Pulse Rate 07/19/20 2028 (!) 103     Resp 07/19/20 2028 18     Temp 07/19/20 2028 98.3 F (36.8 C)     Temp src --      SpO2 07/19/20 2028 98 %     Weight 07/19/20 2028 198 lb (89.8 kg)     Height 07/19/20 2028 5\' 7"  (1.702 m)     Head Circumference --      Peak Flow --      Pain Score 07/19/20 2027 7     Pain Loc --      Pain Edu? --      Excl. in GC? --      Constitutional: Alert and oriented. Well appearing and in no acute distress. Eyes: Conjunctivae are normal. PERRL. EOMI. Head: Atraumatic. ENT:      Nose: No congestion/rhinnorhea.      Mouth/Throat: Mucous membranes are moist.  Neck: No stridor.  No cervical spine tenderness to palpation. Cardiovascular: Normal rate, regular rhythm. Normal S1 and S2.  Good peripheral circulation. Respiratory: Normal respiratory effort without tachypnea or retractions. Lungs CTAB. Good air entry to the bases with no decreased or absent breath sounds Gastrointestinal: Bowel sounds x 4 quadrants. Soft and nontender to palpation. No guarding or rigidity. No distention. Patient has some paraspinal muscle tenderness along the lumbar spine.  Musculoskeletal: Full range of motion to all extremities. No obvious deformities noted Neurologic:  Normal for age. No gross focal neurologic deficits are appreciated.  Skin:  Skin is warm, dry and intact. No rash noted. Psychiatric: Mood and affect are normal for age. Speech and  behavior are normal.   ____________________________________________   LABS (all labs ordered are listed, but only abnormal results are displayed)  Labs Reviewed  URINALYSIS, COMPLETE (UACMP) WITH MICROSCOPIC  POC URINE PREG, ED   ____________________________________________  EKG   ____________________________________________  RADIOLOGY 2028, personally viewed and evaluated these images (plain radiographs) as part of my medical decision making, as well as reviewing the written report by the radiologist.    CT Renal Stone Study  Result Date: 07/20/2020 CLINICAL DATA:  Left flank pain since Wednesday. EXAM: CT ABDOMEN AND PELVIS WITHOUT CONTRAST TECHNIQUE: Multidetector CT imaging of the abdomen and pelvis was performed following the standard protocol without IV contrast. COMPARISON:  02/11/2017 FINDINGS: Lower chest: Lung bases  are clear. Hepatobiliary: No focal liver abnormality is seen. Status post cholecystectomy. No biliary dilatation. Pancreas: Unremarkable. No pancreatic ductal dilatation or surrounding inflammatory changes. Spleen: Normal in size without focal abnormality. Adrenals/Urinary Tract: Adrenal glands are unremarkable. Kidneys are normal, without renal calculi, focal lesion, or hydronephrosis. Bladder is unremarkable. Stomach/Bowel: Stomach, small bowel, and colon are not abnormally distended. Stomach is filled with ingested material. Colon is filled with stool. No wall thickening or inflammatory changes are appreciated. Appendix is normal. Vascular/Lymphatic: No significant vascular findings are present. No enlarged abdominal or pelvic lymph nodes. Reproductive: Uterus and bilateral adnexa are unremarkable. Other: No abdominal wall hernia or abnormality. No abdominopelvic ascites. Musculoskeletal: No acute or significant osseous findings. IMPRESSION: No renal or ureteral stone or obstruction. No acute process demonstrated in the abdomen or pelvis. No evidence of  bowel obstruction or inflammation. Appendix is normal. Electronically Signed   By: Burman Nieves M.D.   On: 07/20/2020 00:03    ____________________________________________    PROCEDURES  Procedure(s) performed:     Procedures     Medications - No data to display   ____________________________________________   INITIAL IMPRESSION / ASSESSMENT AND PLAN / ED COURSE  Pertinent labs & imaging results that were available during my care of the patient were reviewed by me and considered in my medical decision making (see chart for details).      Assessment and plan:  Low back pain 30 year old female presents to the emergency department with left-sided low back pain that started on Wednesday.  Patient was hypertensive and mildly tachycardic at triage but vital signs were otherwise reassuring.  Patient has been seen and evaluated by her primary care provider who had recognized hematuria on urinalysis and was concerned for kidney stone.  Patient primarily presented for CT renal stone study for confirmation.    Patient did undergo a CT renal stone study in the emergency department tonight which showed no signs of nephrolithiasis.  Clinically patient's presenting symptoms suggest lumbar radiculopathy.  I started her on prednisone and Robaxin and advised her to follow back up with primary care as needed.     ____________________________________________  FINAL CLINICAL IMPRESSION(S) / ED DIAGNOSES  Final diagnoses:  Acute left-sided low back pain with sciatica, sciatica laterality unspecified      NEW MEDICATIONS STARTED DURING THIS VISIT:  ED Discharge Orders         Ordered    predniSONE (STERAPRED UNI-PAK 21 TAB) 10 MG (21) TBPK tablet        07/20/20 0020    methocarbamol (ROBAXIN) 500 MG tablet  Every 8 hours PRN        07/20/20 0020              This chart was dictated using voice recognition software/Dragon. Despite best efforts to proofread, errors can  occur which can change the meaning. Any change was purely unintentional.     Orvil Feil, PA-C 07/20/20 0024    Minna Antis, MD 07/22/20 2052

## 2020-07-19 NOTE — ED Triage Notes (Addendum)
Pt states she has had lower left sided back pain since Wednesday, pt states she saw her pcp who ordered a ct scan for this upcoming wednesday but pt states 2 days ago she began to have worsening pain and numbness to her lower back to under her butt. Pt is ambulatory, pt states pain worse at night while she is laying down. Pt given meloxicam and muscle relaxer but has not helped.

## 2020-07-20 MED ORDER — METHOCARBAMOL 500 MG PO TABS: 500.0000 mg | ORAL_TABLET | Freq: Three times a day (TID) | ORAL | 0 refills | Status: AC | PRN

## 2020-07-20 MED ORDER — PREDNISONE 10 MG (21) PO TBPK: ORAL_TABLET | ORAL | 0 refills | Status: DC

## 2020-07-20 MED ORDER — METHOCARBAMOL 500 MG PO TABS: 500.0000 mg | ORAL_TABLET | Freq: Three times a day (TID) | ORAL | 0 refills | Status: DC | PRN

## 2020-07-20 NOTE — ED Notes (Signed)
Pt returned from CT °

## 2020-07-20 NOTE — Discharge Instructions (Signed)
Take tapered steroid as directed.  Take Robaxin at night before bed for muscle spasms.

## 2022-06-11 ENCOUNTER — Emergency Department
Admission: EM | Admit: 2022-06-11 | Discharge: 2022-06-11 | Disposition: A | Discharge status: Home / Self Care | Payer: 59 | Attending: Emergency Medicine | Admitting: Emergency Medicine

## 2022-06-11 ENCOUNTER — Other Ambulatory Visit: Payer: Self-pay

## 2022-06-11 DIAGNOSIS — M791 Myalgia, unspecified site: Secondary | ICD-10-CM | POA: Insufficient documentation

## 2022-06-11 DIAGNOSIS — M62838 Other muscle spasm: Secondary | ICD-10-CM | POA: Diagnosis not present

## 2022-06-11 DIAGNOSIS — M545 Low back pain, unspecified: Secondary | ICD-10-CM | POA: Insufficient documentation

## 2022-06-11 DIAGNOSIS — M436 Torticollis: Secondary | ICD-10-CM | POA: Diagnosis not present

## 2022-06-11 DIAGNOSIS — M542 Cervicalgia: Secondary | ICD-10-CM | POA: Diagnosis present

## 2022-06-11 MED ORDER — ONDANSETRON 4 MG PO TBDP: 4.0000 mg | ORAL_TABLET | Freq: Three times a day (TID) | ORAL | 0 refills | Status: DC | PRN

## 2022-06-11 MED ORDER — CYCLOBENZAPRINE HCL 5 MG PO TABS: 5.0000 mg | ORAL_TABLET | Freq: Three times a day (TID) | ORAL | 0 refills | Status: DC | PRN

## 2022-06-11 NOTE — ED Provider Notes (Signed)
   [facility] Provider Note    Event Date/Time   First MD Initiated Contact with Patient 06/11/22 2226     (approximate)  History   Chief Complaint: Torticollis  HPI  Jill Aguilar is a 32 y.o. female with a past medical history of depression, was recently diagnosed with influenza this past Tuesday presents emergency department for neck discomfort and back pain.  According to the patient she has been experiencing spasms of her neck and back muscles since being diagnosed with influenza on Tuesday.  Patient has been using heating pad and thought he would be better by today but states it is still occurring so she came to the emergency department.  Patient denies any known recent fevers but still states nausea which she relates to some cough and influenza.  Physical Exam   Triage Vital Signs: ED Triage Vitals  Enc Vitals Group     BP 06/11/22 2042 131/66     Pulse Rate 06/11/22 2042 74     Resp 06/11/22 2042 18     Temp 06/11/22 2042 98.4 F (36.9 C)     Temp Source 06/11/22 2042 Oral     SpO2 06/11/22 2042 100 %     Weight 06/11/22 2042 190 lb (86.2 kg)     Height 06/11/22 2042 5' 7"$  (1.702 m)     Head Circumference --      Peak Flow --      Pain Score 06/11/22 2048 7     Pain Loc --      Pain Edu? --      Excl. in [place]? --     Most recent vital signs: Vitals:   06/11/22 2042  BP: 131/66  Pulse: 74  Resp: 18  Temp: 98.4 F (36.9 C)  SpO2: 100%    General: Awake, no distress.  CV:  Good peripheral perfusion.  Regular rate and rhythm  Resp:  Normal effort.  Equal breath sounds bilaterally.  Abd:  No distention.  Soft, nontender.  No rebound or guarding. Other:  Mild paraspinal cervical spine tenderness as well as paraspinal thoracic spine tenderness no midline tenderness.   ED Results / Procedures / Treatments    MEDICATIONS ORDERED IN ED: Medications - No data to display   IMPRESSION / MDM / [place] / ED COURSE   I reviewed the triage vital signs and the nursing notes.  Patient's presentation is most consistent with acute illness / injury with system symptoms.  Patient presents emergency department for neck and back muscle pain/spasm at times.  She relates this to influenza the coughing.  Continues to have some nausea.  Overall the patient appears well, reassuring physical exam clear lung sounds.  Has some paraspinal tenderness along the neck and upper back but no midline tenderness no concerning findings.  Normal oropharynx.  Will place the patient on Flexeril as well as Zofran to be used as needed have the patient continue to use Tylenol or ibuprofen as well as a heating pad as needed.  Patient agreeable to plan.  FINAL CLINICAL IMPRESSION(S) / ED DIAGNOSES   Muscular pain Muscle spasm  Rx / DC Orders   Flexeril Zofran  Note:  This document was prepared using Dragon voice recognition software and may include unintentional dictation errors.   Harvest Dark, MD 06/11/22 2228

## 2022-06-11 NOTE — ED Triage Notes (Signed)
Pt presents to ER with c/o neck pain that started 2/21.  Pt states she was dx with flu on 2/20.  Pt states her neck hurts when moving in any direction except for when moving her head backwards.  Pt states when she moved her head, she feels a burning pain from back of her neck down to her t-spine area.  Pt denies any fever.  Pt is otherwise A&O x4 and in NAD.

## 2022-07-19 ENCOUNTER — Other Ambulatory Visit: Payer: Self-pay

## 2022-07-19 DIAGNOSIS — R112 Nausea with vomiting, unspecified: Secondary | ICD-10-CM | POA: Diagnosis not present

## 2022-07-19 DIAGNOSIS — R519 Headache, unspecified: Secondary | ICD-10-CM | POA: Diagnosis not present

## 2022-07-19 DIAGNOSIS — R059 Cough, unspecified: Secondary | ICD-10-CM | POA: Diagnosis not present

## 2022-07-19 DIAGNOSIS — R0981 Nasal congestion: Secondary | ICD-10-CM | POA: Insufficient documentation

## 2022-07-19 DIAGNOSIS — R6883 Chills (without fever): Secondary | ICD-10-CM | POA: Insufficient documentation

## 2022-07-19 DIAGNOSIS — Z1152 Encounter for screening for COVID-19: Secondary | ICD-10-CM | POA: Insufficient documentation

## 2022-07-19 DIAGNOSIS — R197 Diarrhea, unspecified: Secondary | ICD-10-CM | POA: Insufficient documentation

## 2022-07-19 DIAGNOSIS — R61 Generalized hyperhidrosis: Secondary | ICD-10-CM | POA: Diagnosis not present

## 2022-07-19 LAB — POC URINE PREG, ED: Preg Test, Ur: NEGATIVE

## 2022-07-19 LAB — COMPREHENSIVE METABOLIC PANEL
ALT: 25 U/L (ref 0–44)
AST: 25 U/L (ref 15–41)
Albumin: 4.2 g/dL (ref 3.5–5.0)
Alkaline Phosphatase: 71 U/L (ref 38–126)
Anion gap: 9 (ref 5–15)
BUN: 14 mg/dL (ref 6–20)
CO2: 24 mmol/L (ref 22–32)
Calcium: 9.2 mg/dL (ref 8.9–10.3)
Chloride: 105 mmol/L (ref 98–111)
Creatinine, Ser: 0.81 mg/dL (ref 0.44–1.00)
GFR, Estimated: >60 mL/min (ref >60)
Glucose, Bld: 105 mg/dL — ABNORMAL HIGH (ref 70–99)
Potassium: 3.4 mmol/L — ABNORMAL LOW (ref 3.5–5.1)
Sodium: 138 mmol/L (ref 135–145)
Total Bilirubin: 0.7 mg/dL (ref 0.3–1.2)
Total Protein: 7.8 g/dL (ref 6.5–8.1)

## 2022-07-19 LAB — CBC
HCT: 43.1 % (ref 36.0–46.0)
Hemoglobin: 13.9 g/dL (ref 12.0–15.0)
MCH: 28.8 pg (ref 26.0–34.0)
MCHC: 32.3 g/dL (ref 30.0–36.0)
MCV: 89.4 fL (ref 80.0–100.0)
Platelets: 295 10*3/uL (ref 150–400)
RBC: 4.82 MIL/uL (ref 3.87–5.11)
RDW: 13 % (ref 11.5–15.5)
WBC: 6.7 10*3/uL (ref 4.0–10.5)
nRBC: 0 % (ref 0.0–0.2)

## 2022-07-19 LAB — URINALYSIS, ROUTINE W REFLEX MICROSCOPIC
Bilirubin Urine: NEGATIVE
Glucose, UA: NEGATIVE mg/dL
Ketones, ur: 20 mg/dL — AB
Leukocytes,Ua: NEGATIVE
Nitrite: NEGATIVE
Protein, ur: 30 mg/dL — AB
RBC / HPF: >50 RBC/hpf (ref 0–5)
Specific Gravity, Urine: 1.033 — ABNORMAL HIGH (ref 1.005–1.030)
pH: 5 (ref 5.0–8.0)

## 2022-07-19 LAB — SARS CORONAVIRUS 2 BY RT PCR: SARS Coronavirus 2 by RT PCR: NEGATIVE

## 2022-07-19 LAB — LIPASE, BLOOD: Lipase: 22 U/L (ref 11–51)

## 2022-07-19 NOTE — ED Triage Notes (Signed)
Pt to ED via POV c/o flu like symptoms, vomiting, diarrhea. Pt reports runny nose, headache, chills since Monday. Pt also reports vomiting and diarrhea sionce last night. Pt denies fevers, SP, SOB

## 2022-07-20 ENCOUNTER — Emergency Department: Payer: 59

## 2022-07-20 ENCOUNTER — Emergency Department
Admission: EM | Admit: 2022-07-20 | Discharge: 2022-07-20 | Disposition: A | Discharge status: Home / Self Care | Payer: 59 | Attending: Emergency Medicine | Admitting: Emergency Medicine

## 2022-07-20 DIAGNOSIS — R112 Nausea with vomiting, unspecified: Secondary | ICD-10-CM

## 2022-07-20 DIAGNOSIS — R6889 Other general symptoms and signs: Secondary | ICD-10-CM

## 2022-07-20 MED ORDER — SODIUM CHLORIDE 0.9 % IV BOLUS (SEPSIS)
1000.0000 mL | Freq: Once | INTRAVENOUS | Status: AC
  Administered 2022-07-20: 1000 mL via INTRAVENOUS

## 2022-07-20 MED ORDER — KETOROLAC TROMETHAMINE 30 MG/ML IJ SOLN
30.0000 mg | Freq: Once | INTRAMUSCULAR | Status: AC
  Administered 2022-07-20: 30 mg via INTRAVENOUS
  Filled 2022-07-20: qty 1

## 2022-07-20 MED ORDER — ONDANSETRON 4 MG PO TBDP: 4.0000 mg | ORAL_TABLET | Freq: Four times a day (QID) | ORAL | 0 refills | Status: DC | PRN

## 2022-07-20 MED ORDER — ONDANSETRON HCL 4 MG/2ML IJ SOLN
4.0000 mg | Freq: Once | INTRAMUSCULAR | Status: AC
  Administered 2022-07-20: 4 mg via INTRAVENOUS
  Filled 2022-07-20: qty 2

## 2022-07-20 NOTE — ED Provider Notes (Signed)
[facility] Provider Note    Event Date/Time   First MD Initiated Contact with Patient 07/20/22 0125     (approximate)   History   Nasal Congestion and Emesis   HPI  Jill Aguilar Jill Aguilar is a 32 y.o. female with history of depression and anxiety who presents to the emergency department with complaints of nasal congestion, cough, chills, sweats, vomiting, diarrhea, headache for the past 3 days.  No urinary symptoms.  Previous C-section, cholecystectomy.  Denies abdominal pain.   History provided by patient.    Past Medical History:  Diagnosis Date   Anxiety    Depression    Heart murmur    childhood   Hx of chlamydia infection    Nausea    Pregnancy 15 weeks as of 05-07-17   Vitamin D deficiency     Past Surgical History:  Procedure Laterality Date   CESAREAN SECTION     fetal intolerance   LAPAROSCOPIC CHOLECYSTECTOMY SINGLE PORT N/A 05/17/2017   Procedure: LAPAROSCOPIC CHOLECYSTECTOMY SINGLE SITE;  Surgeon: Michael Boston, MD;  Location: [facility];  Service: General;  Laterality: N/A;   STERIOD INJECTION  05/17/2017   Procedure: STEROID INJECTION OF INCISION WITH KENALOG 40MG ;  Surgeon: Michael Boston, MD;  Location: [facility];  Service: General;;   WISDOM TOOTH EXTRACTION Bilateral 10/2002    MEDICATIONS:  Prior to Admission medications   Medication Sig Start Date End Date Taking? Authorizing Provider  cyclobenzaprine (FLEXERIL) 5 MG tablet Take 1 tablet (5 mg total) by mouth 3 (three) times daily as needed for muscle spasms. 06/11/22   Harvest Dark, MD  diphenoxylate-atropine (LOMOTIL) 2.5-0.025 MG tablet Take 1 tablet by mouth 4 (four) times daily as needed for diarrhea or loose stools. 04/14/20   Coral Spikes, DO  ibuprofen (ADVIL,MOTRIN) 600 MG tablet Take 1 tablet (600 mg total) by mouth every 6 (six) hours as needed for mild pain, moderate pain or cramping. 10/20/17   Marikay Alar, CNM  ondansetron (ZOFRAN-ODT) 4 MG disintegrating  tablet Take 1 tablet (4 mg total) by mouth every 8 (eight) hours as needed for nausea or vomiting. 06/11/22   Harvest Dark, MD  predniSONE (STERAPRED UNI-PAK 21 TAB) 10 MG (21) TBPK tablet 6,5,4,3,2,1 07/20/20   Thanvi Blincoe, Delice Bison, DO    Physical Exam   Triage Vital Signs: ED Triage Vitals  Enc Vitals Group     BP 07/19/22 1939 (!) 159/88     Pulse Rate 07/19/22 1939 88     Resp 07/19/22 1939 18     Temp 07/19/22 1939 98.4 F (36.9 C)     Temp Source 07/19/22 1939 Oral     SpO2 07/19/22 1939 99 %     Weight 07/19/22 1937 190 lb (86.2 kg)     Height 07/19/22 1937 5\' 6"  (1.676 m)     Head Circumference --      Peak Flow --      Pain Score 07/19/22 1936 10     Pain Loc --      Pain Edu? --      Excl. in [place]? --     Most recent vital signs: Vitals:   07/19/22 1939 07/20/22 0201  BP: (!) 159/88 (!) 138/90  Pulse: 88 79  Resp: 18 16  Temp: 98.4 F (36.9 C) 98.5 F (36.9 C)  SpO2: 99% 100%    CONSTITUTIONAL: Alert, responds appropriately to questions. Well-appearing; well-nourished HEAD: Normocephalic, atraumatic EYES: Conjunctivae clear, pupils appear equal, sclera  nonicteric ENT: normal nose; moist mucous membranes NECK: Supple, normal ROM CARD: RRR; S1 and S2 appreciated RESP: Normal chest excursion without splinting or tachypnea; breath sounds clear and equal bilaterally; no wheezes, no rhonchi, no rales, no hypoxia or respiratory distress, speaking full sentences ABD/GI: Non-distended; soft, non-tender, no rebound, no guarding, no peritoneal signs BACK: The back appears normal EXT: Normal ROM in all joints; no deformity noted, no edema SKIN: Normal color for age and race; warm; no rash on exposed skin NEURO: Moves all extremities equally, normal speech PSYCH: The patient's mood and manner are appropriate.   ED Results / Procedures / Treatments   LABS: (all labs ordered are listed, but only abnormal results are displayed) Labs Reviewed  COMPREHENSIVE METABOLIC  PANEL - Abnormal; Notable for the following components:      Result Value   Potassium 3.4 (*)    Glucose, Bld 105 (*)    All other components within normal limits  URINALYSIS, ROUTINE W REFLEX MICROSCOPIC - Abnormal; Notable for the following components:   Color, Urine YELLOW (*)    APPearance HAZY (*)    Specific Gravity, Urine 1.033 (*)    Hgb urine dipstick MODERATE (*)    Ketones, ur 20 (*)    Protein, ur 30 (*)    Bacteria, UA RARE (*)    All other components within normal limits  SARS CORONAVIRUS 2 BY RT PCR  LIPASE, BLOOD  CBC  POC URINE PREG, ED     EKG:   RADIOLOGY: My personal review and interpretation of imaging: This x-ray clear.  I have personally reviewed all radiology reports.   DG Chest Portable 1 View  Result Date: 07/20/2022 CLINICAL DATA:  Cough, shortness of breath EXAM: PORTABLE CHEST 1 VIEW COMPARISON:  03/17/2022 FINDINGS: Cardiac and mediastinal contours are within normal limits. No focal pulmonary opacity. No pleural effusion or pneumothorax. No acute osseous abnormality. IMPRESSION: No acute cardiopulmonary process. Electronically Signed   By: Merilyn Baba M.D.   On: 07/20/2022 03:19     PROCEDURES:  Critical Care performed: No    Procedures    IMPRESSION / MDM / ASSESSMENT AND PLAN / ED COURSE  I reviewed the triage vital signs and the nursing notes.    Patient here with flulike symptoms.     DIFFERENTIAL DIAGNOSIS (includes but not limited to):   URI, gastroenteritis, pneumonia, doubt appendicitis, bowel obstruction, UTI, meningitis   Patient's presentation is most consistent with acute complicated illness / injury requiring diagnostic workup.   PLAN: Patient's labs from triage show no leukocytosis, normal electrolytes, creatinine, LFTs and lipase.  Urine does show some red blood cells but she is on her menstrual cycle and has no urinary symptoms.  Abdominal exam benign.  COVID-negative.  Patient was not tested for the flu but I  do not feel this is clinically indicated given she is outside treatment window for Tamiflu or Xofluza and would not change her management.  She does complain of cough and feeling short of breath.  Will obtain chest x-ray.  Will give IV fluids, Toradol, Zofran for symptomatic relief and reassess.  Will p.o. challenge.   MEDICATIONS GIVEN IN ED: Medications  ondansetron (ZOFRAN) injection 4 mg (4 mg Intravenous Given 07/20/22 0227)  ketorolac (TORADOL) 30 MG/ML injection 30 mg (30 mg Intravenous Given 07/20/22 0227)  sodium chloride 0.9 % bolus 1,000 mL (1,000 mLs Intravenous New Bag/Given 07/20/22 0227)     ED COURSE: Chest x-ray reviewed and interpreted by myself and shows  no pneumonia.  Patient reports feeling much better and tolerating p.o.  I feel she is safe for discharge home.  Discussed supportive care instructions and return precautions.  Recommended bland diet.  Will discharge with Zofran.  Cussed with patient again that I suspect viral illness causing her symptoms.  No indication for antibiotics today.   At this time, I do not feel there is any life-threatening condition present. I reviewed all nursing notes, vitals, pertinent previous records.  All lab and urine results, EKGs, imaging ordered have been independently reviewed and interpreted by myself.  I reviewed all available radiology reports from any imaging ordered this visit.  Based on my assessment, I feel the patient is safe to be discharged home without further emergent workup and can continue workup as an outpatient as needed. Discussed all findings, treatment plan as well as usual and customary return precautions.  They verbalize understanding and are comfortable with this plan.  Outpatient follow-up has been provided as needed.  All questions have been answered.    CONSULTS:  none   OUTSIDE RECORDS REVIEWED: Reviewed last internal medicine note on 06/06/2022 where patient was diagnosed with influenza A.       FINAL CLINICAL  IMPRESSION(S) / ED DIAGNOSES   Final diagnoses:  Flu-like symptoms  Nausea vomiting and diarrhea     Rx / DC Orders   ED Discharge Orders          Ordered    ondansetron (ZOFRAN-ODT) 4 MG disintegrating tablet  Every 6 hours PRN        07/20/22 0330             Note:  This document was prepared using Dragon voice recognition software and may include unintentional dictation errors.   Ioma Chismar, Delice Bison, DO 07/20/22 [phone]

## 2022-07-20 NOTE — Discharge Instructions (Signed)
You may alternate Tylenol 1000 mg every 6 hours as needed for pain, fever and Ibuprofen 800 mg every 6-8 hours as needed for pain, fever.  Please take Ibuprofen with food.  Do not take more than 4000 mg of Tylenol (acetaminophen) in a 24 hour period. ? ? ?You may use over-the-counter Imodium as needed for diarrhea. ?

## 2022-07-20 NOTE — ED Notes (Signed)
Pt given ice chips, gingerale and saltines as tolerated. Pt able to sip gingerale and ate 2 saltines without dificulty. IV bolus continues to infuse.Pt reports headache is better after medication

## 2023-01-10 IMAGING — CT CT RENAL STONE PROTOCOL
2 of 4 series · 17 of 46 positions shown, 19 images · non-contrast
Comparison: 02/11/2017

CLINICAL DATA: Left flank pain since [REDACTED].

EXAM:
CT ABDOMEN AND PELVIS WITHOUT CONTRAST
TECHNIQUE: Multidetector CT imaging of the abdomen and pelvis was performed
following the standard protocol without IV contrast.

[Series 2: stone full standard · axial · 0.73mm/px · z∈[-817,-367]mm · 14 of 100 slices shown, 16 images]
[im 5/100  soft-tissue]
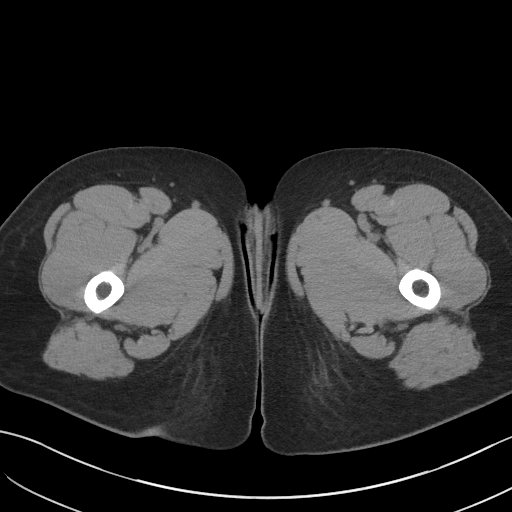
[im 5/100  bone]
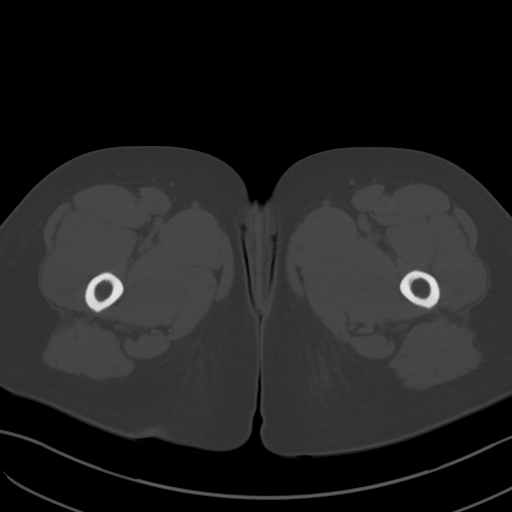
[im 13/100  soft-tissue]
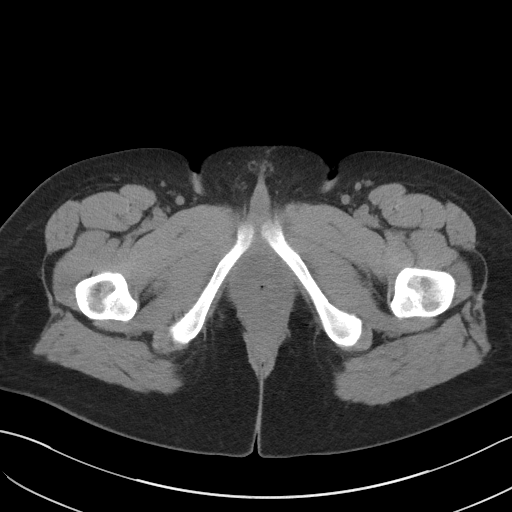
[im 21/100  soft-tissue]
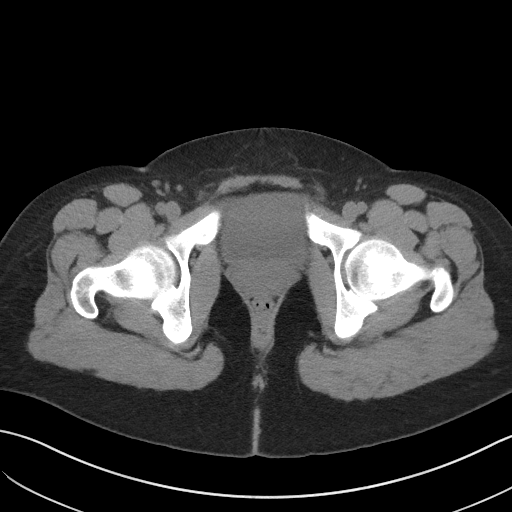
[im 25/100  soft-tissue]
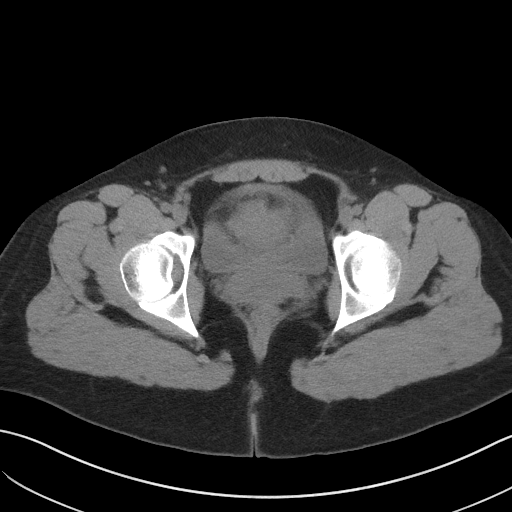
[im 34/100  soft-tissue]
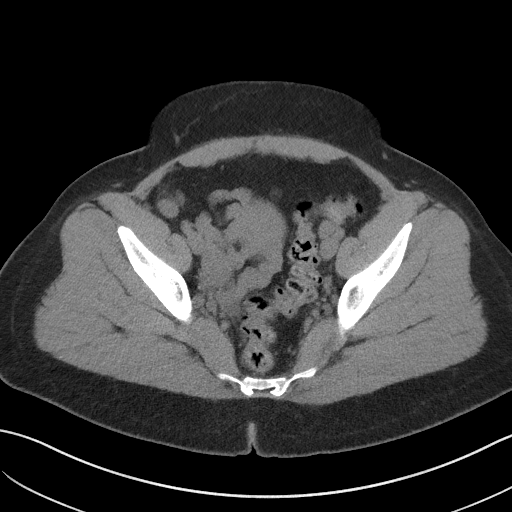
[im 42/100  soft-tissue]
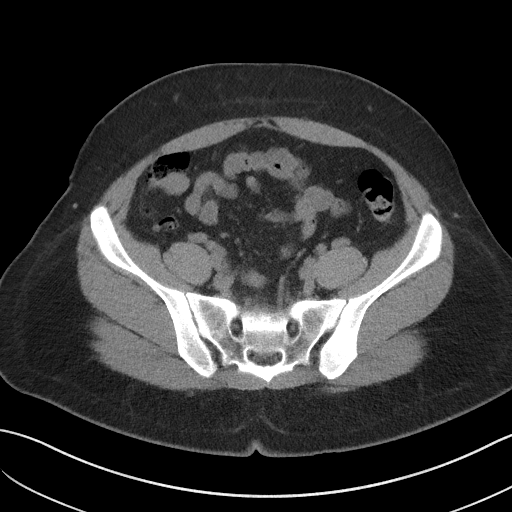
[im 46/100  soft-tissue]
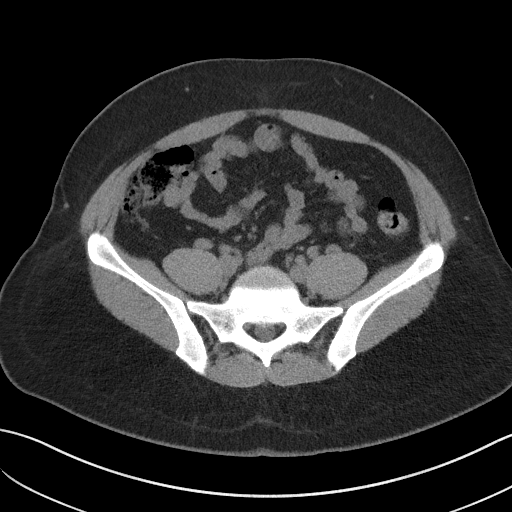
[im 54/100  soft-tissue]
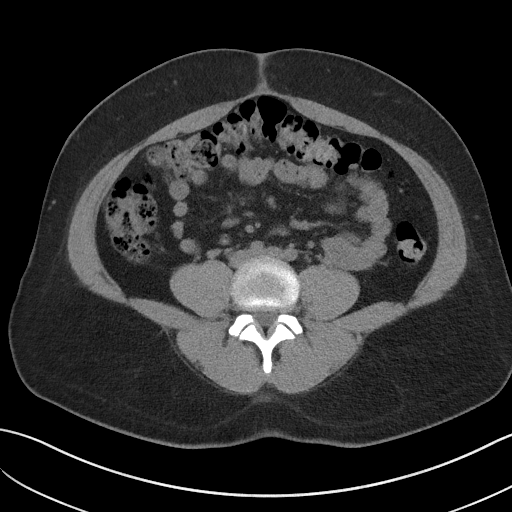
[im 58/100  soft-tissue]
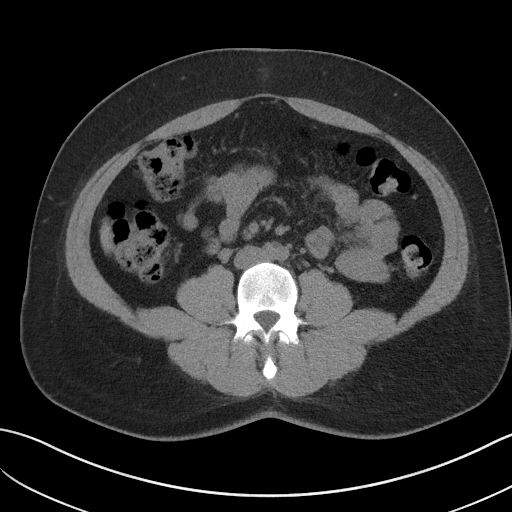
[im 58/100  bone]
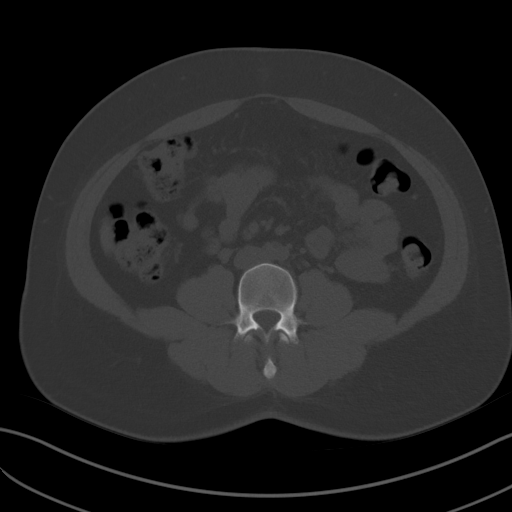
[im 67/100  soft-tissue]
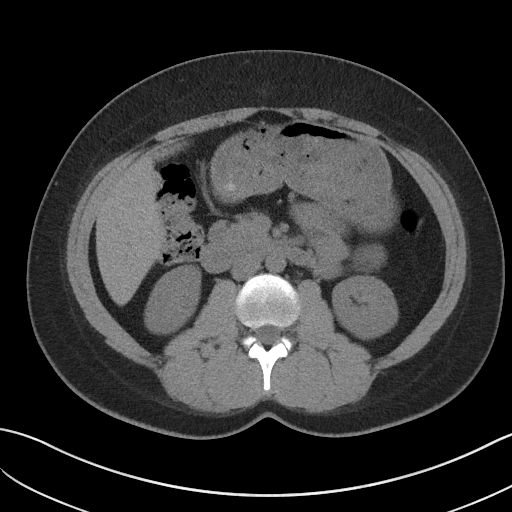
[im 75/100  soft-tissue]
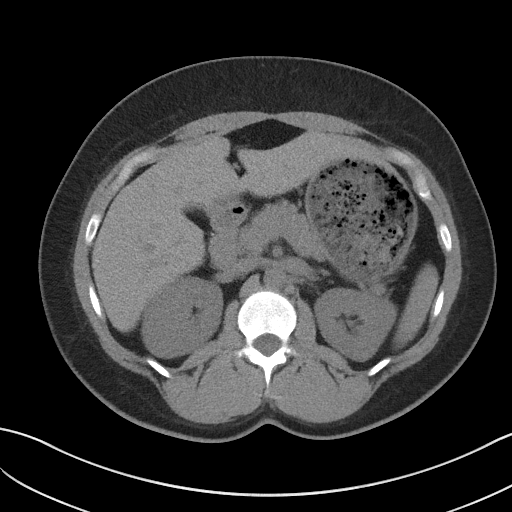
[im 79/100  soft-tissue]
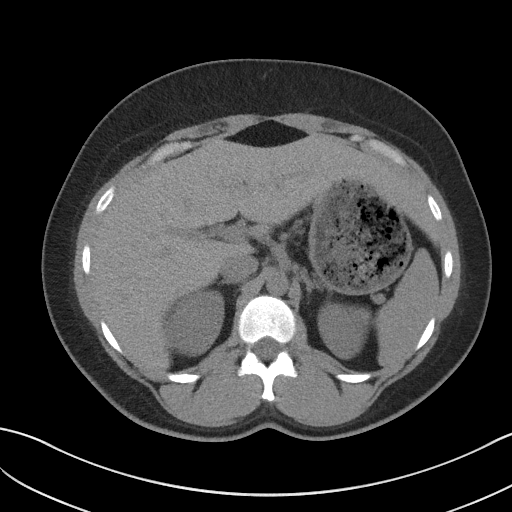
[im 87/100  soft-tissue]
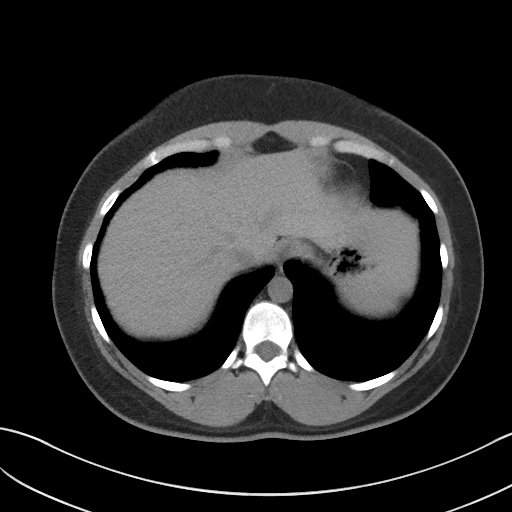
[im 95/100  soft-tissue]
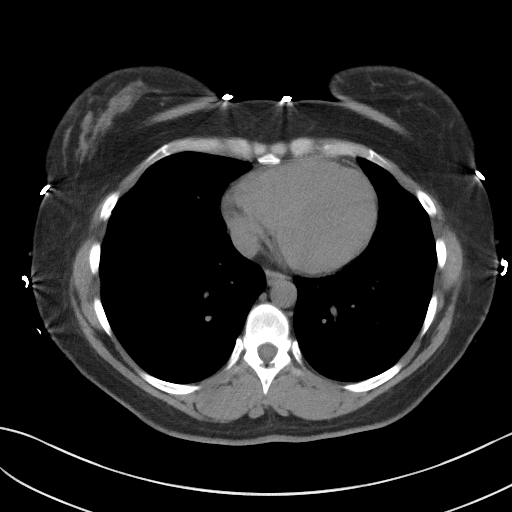

[Series 5: coronal · coronal · 0.87mm/px · 3 of 151 slices shown]
[im 51/151  soft-tissue]
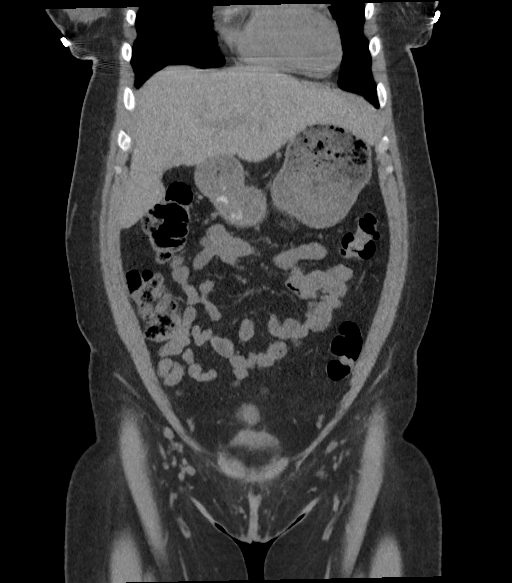
[im 67/151  soft-tissue]
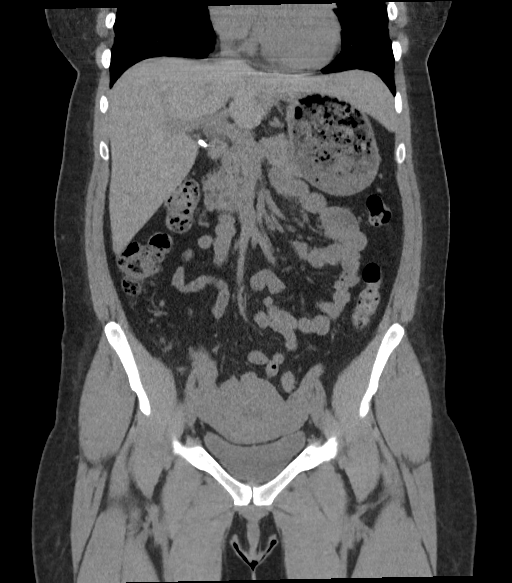
[im 84/151  soft-tissue]
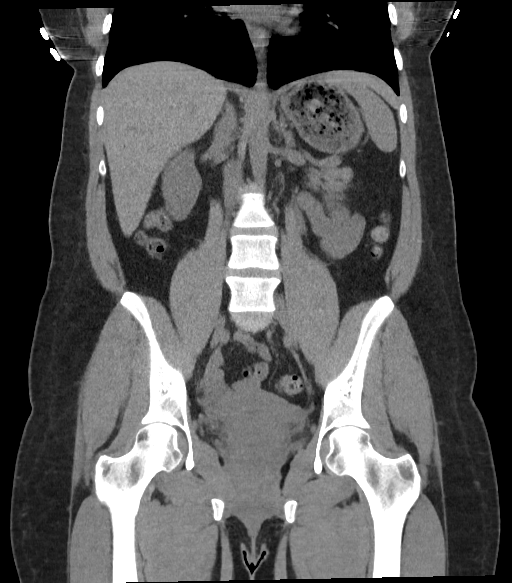

[17 of 46 positions shown; findings below may reference images not displayed]

FINDINGS: Lower chest: Lung bases are clear.

Hepatobiliary: No focal liver abnormality is seen. Status post
cholecystectomy. No biliary dilatation.

Pancreas: Unremarkable. No pancreatic ductal dilatation or
surrounding inflammatory changes.

Spleen: Normal in size without focal abnormality.

Adrenals/Urinary Tract: Adrenal glands are unremarkable. Kidneys are
normal, without renal calculi, focal lesion, or hydronephrosis.
Bladder is unremarkable.

Stomach/Bowel: Stomach, small bowel, and colon are not abnormally
distended. Stomach is filled with ingested material. Colon is filled
with stool. No wall thickening or inflammatory changes are
appreciated. Appendix is normal.

Vascular/Lymphatic: No significant vascular findings are present. No
enlarged abdominal or pelvic lymph nodes.

Reproductive: Uterus and bilateral adnexa are unremarkable.

Other: No abdominal wall hernia or abnormality. No abdominopelvic
ascites.

Musculoskeletal: No acute or significant osseous findings.
IMPRESSION: No renal or ureteral stone or obstruction. No acute process
demonstrated in the abdomen or pelvis. No evidence of bowel
obstruction or inflammation. Appendix is normal.

## 2023-03-24 ENCOUNTER — Emergency Department: Payer: Self-pay

## 2023-03-24 ENCOUNTER — Other Ambulatory Visit: Payer: Self-pay

## 2023-03-24 ENCOUNTER — Emergency Department
Admission: EM | Admit: 2023-03-24 | Discharge: 2023-03-24 | Disposition: A | Discharge status: Home / Self Care | Payer: Self-pay | Attending: Emergency Medicine | Admitting: Emergency Medicine

## 2023-03-24 DIAGNOSIS — W010XXA Fall on same level from slipping, tripping and stumbling without subsequent striking against object, initial encounter: Secondary | ICD-10-CM | POA: Insufficient documentation

## 2023-03-24 DIAGNOSIS — S300XXA Contusion of lower back and pelvis, initial encounter: Secondary | ICD-10-CM | POA: Insufficient documentation

## 2023-03-24 LAB — POC URINE PREG, ED: Preg Test, Ur: NEGATIVE

## 2023-03-24 MED ORDER — LIDOCAINE 5 % EX PTCH
1.0000 | MEDICATED_PATCH | CUTANEOUS | Status: DC
  Administered 2023-03-24: 1 via TRANSDERMAL
  Filled 2023-03-24: qty 1

## 2023-03-24 MED ORDER — KETOROLAC TROMETHAMINE 15 MG/ML IJ SOLN
15.0000 mg | Freq: Once | INTRAMUSCULAR | Status: AC
  Administered 2023-03-24: 15 mg via INTRAMUSCULAR
  Filled 2023-03-24: qty 1

## 2023-03-24 MED ORDER — NAPROXEN 500 MG PO TABS: 500.0000 mg | ORAL_TABLET | Freq: Two times a day (BID) | ORAL | 0 refills | Status: AC

## 2023-03-24 MED ORDER — LIDOCAINE 5 % EX PTCH: 1.0000 | MEDICATED_PATCH | Freq: Two times a day (BID) | CUTANEOUS | 0 refills | Status: AC

## 2023-03-24 MED ORDER — DOCUSATE SODIUM 100 MG PO CAPS: 100.0000 mg | ORAL_CAPSULE | Freq: Two times a day (BID) | ORAL | 0 refills | Status: AC

## 2023-03-24 NOTE — ED Triage Notes (Signed)
Pt to ED for fall 2 days ago on tailbone. Was seen at [facility] and had xray. Reports continued pain.

## 2023-03-24 NOTE — ED Provider Notes (Signed)
[facility] Provider Note    Event Date/Time   First MD Initiated Contact with Patient 03/24/23 1003     (approximate)   History   Fall   HPI  Jill Aguilar is a 32 y.o. female who presents today for evaluation of tailbone pain.  Patient reports that she slipped on the wet ground 2 days ago landed directly on her buttocks.  She reports that she has had pain in her buttocks ever since.  She denies difficulty moving her bowels or with urinating.  She reports that her pain is worse with sitting and improved with standing.  She is able to walk.  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          Physical Exam   Triage Vital Signs: ED Triage Vitals  Encounter Vitals Group     BP 03/24/23 0933 (!) 139/114     Systolic BP Percentile --      Diastolic BP Percentile --      Pulse Rate 03/24/23 0933 93     Resp 03/24/23 0933 18     Temp 03/24/23 0935 97.8 F (36.6 C)     Temp src --      SpO2 03/24/23 0933 100 %     Weight 03/24/23 0933 190 lb (86.2 kg)     Height 03/24/23 0933 5\' 6"  (1.676 m)     Head Circumference --      Peak Flow --      Pain Score 03/24/23 0933 8     Pain Loc --      Pain Education --      Exclude from Growth Chart --     Most recent vital signs: Vitals:   03/24/23 0933 03/24/23 0935  BP: (!) 139/114   Pulse: 93   Resp: 18   Temp:  97.8 F (36.6 C)  SpO2: 100%     Physical Exam Vitals and nursing note reviewed.  Constitutional:      General: Awake and  alert. No acute distress.    Appearance: Normal appearance. The patient is normal weight.  HENT:     Head: Normocephalic and atraumatic.     Mouth: Mucous membranes are moist.  Eyes:     General: PERRL. Normal EOMs        Right eye: No discharge.        Left eye: No discharge.     Conjunctiva/sclera: Conjunctivae normal.  Cardiovascular:     Rate and Rhythm: Normal rate and regular rhythm.     Pulses: Normal pulses.  Pulmonary:     Effort: Pulmonary effort is normal. No respiratory distress.     Breath sounds: Normal breath sounds.  Abdominal:     Abdomen is soft. There is no abdominal tenderness. No rebound or guarding. No distention. Musculoskeletal:        General: No swelling. Normal range of motion.     Cervical back: Normal range of motion and neck supple.  Skin:    General: Skin is warm and dry.     Capillary Refill: Capillary  refill takes less than 2 seconds.     Findings: No rash.  Neurological:     Mental Status: The patient is awake and alert.      ED Results / Procedures / Treatments   Labs (all labs ordered are listed, but only abnormal results are displayed) Labs Reviewed  POC URINE PREG, ED - Normal     EKG     RADIOLOGY I independently reviewed and interpreted imaging and agree with radiologists findings.     PROCEDURES:  Critical Care performed:   Procedures   MEDICATIONS ORDERED IN ED: Medications  lidocaine (LIDODERM) 5 % 1 patch (1 patch Transdermal Patch Applied 03/24/23 1021)  ketorolac (TORADOL) 15 MG/ML injection 15 mg (15 mg Intramuscular Given 03/24/23 1022)     IMPRESSION / MDM / ASSESSMENT AND PLAN / ED COURSE  I reviewed the triage vital signs and the nursing notes.   Differential diagnosis includes, but is not limited to, contusion, fracture, less likely hematoma.  Patient is awake and alert, hemodynamically stable and afebrile.  She is neurologically and neurovascularly intact.  She has normal strength and sensation of  bilateral lower extremities.  She has tenderness to her coccyx without overlying skin changes or ecchymosis.  She is standing in the room because she reports significant pain with sitting.  I am unable to view her visit from the urgent care, and I cannot see her x-rays.  She agrees to repeat x-rays today.  Will also add her lumbar spine given mild tenderness in this location as well.  Pregnancy was negative, she was treated with Toradol and Lidoderm patch with good effect.  X-rays were negative today which patient is reassured about.  We also discussed sitting on the donut pillows in order to relieve the pressure on this area.  She was given stool softeners to make moving her bowels less uncomfortable.  We discussed strict return precautions and the importance of close outpatient follow-up.  Patient or stands and agrees with plan.  He was discharged in stable condition.   Patient's presentation is most consistent with acute complicated illness / injury requiring diagnostic workup.    FINAL CLINICAL IMPRESSION(S) / ED DIAGNOSES   Final diagnoses:  Contusion of sacrum, initial encounter     Rx / DC Orders   ED Discharge Orders          Ordered    naproxen (NAPROSYN) 500 MG tablet  2 times daily with meals        03/24/23 1113    lidocaine (LIDODERM) 5 %  Every 12 hours        03/24/23 1113    docusate sodium (COLACE) 100 MG capsule  2 times daily        03/24/23 1113             Note:  This document was prepared using Dragon voice recognition software and may include unintentional dictation errors.   Jackelyn Hoehn, PA-C 03/24/23 1438    Janith Lima, MD 03/24/23 [phone]

## 2023-03-24 NOTE — Discharge Instructions (Signed)
You may take the pain medicines to help with your pain, and also use the stool softeners to help with your bowel movements.  It may also be helpful to sit on a donut pillow.  Please return for any new, worsening, or change in symptoms or other concerns.  It was a pleasure caring for you today.

## 2023-04-08 ENCOUNTER — Other Ambulatory Visit: Payer: Self-pay

## 2023-04-08 ENCOUNTER — Emergency Department
Admission: EM | Admit: 2023-04-08 | Discharge: 2023-04-08 | Disposition: A | Discharge status: Home / Self Care | Payer: Self-pay | Attending: Emergency Medicine | Admitting: Emergency Medicine

## 2023-04-08 DIAGNOSIS — L03113 Cellulitis of right upper limb: Secondary | ICD-10-CM | POA: Insufficient documentation

## 2023-04-08 DIAGNOSIS — Z91040 Latex allergy status: Secondary | ICD-10-CM | POA: Insufficient documentation

## 2023-04-08 DIAGNOSIS — R739 Hyperglycemia, unspecified: Secondary | ICD-10-CM | POA: Insufficient documentation

## 2023-04-08 DIAGNOSIS — L0390 Cellulitis, unspecified: Secondary | ICD-10-CM

## 2023-04-08 DIAGNOSIS — R21 Rash and other nonspecific skin eruption: Secondary | ICD-10-CM | POA: Insufficient documentation

## 2023-04-08 MED ORDER — DOXYCYCLINE HYCLATE 100 MG PO CAPS: 100.0000 mg | ORAL_CAPSULE | Freq: Two times a day (BID) | ORAL | 0 refills | Status: DC

## 2023-04-08 MED ORDER — PREDNISONE 20 MG PO TABS: ORAL_TABLET | ORAL | 0 refills | Status: DC

## 2023-04-08 MED ORDER — TRIAMCINOLONE ACETONIDE 0.1 % EX CREA: 1.0000 | TOPICAL_CREAM | Freq: Two times a day (BID) | CUTANEOUS | 1 refills | Status: DC

## 2023-04-08 NOTE — ED Triage Notes (Signed)
Pt presents to ER with c/o right distal arm rash that started 2 days ago.  Pt reports hx of eczema, and does not have any cream for her rash.  Rash appears to be scaly looking in nature, with some clear drainage.  Pt states rash is itchy and painful at this time.  Pt otherwise A&O x4 and in NAD at this time.

## 2023-04-08 NOTE — Discharge Instructions (Signed)
Please take steroid and antibiotic as directed, clean and treat with cream. Follow up with primary care for further evaluation. Return here for worsening symptoms.

## 2023-04-08 NOTE — ED Provider Notes (Signed)
EMERGENCY DEPARTMENT AT [facility] Provider Note   CSN: [id number] Arrival date & time: 04/08/23  4098     History  Chief Complaint  Patient presents with   Rash    Jill Aguilar Jill Aguilar is a 32 y.o. female.  Patient here for rash.  Notes 2 days of dry scaly rash of right wrist.  This is consistent with eczema rash that she has had in the past.  Previously tried betamethasone without improvement.  Denies any new medications.  No fevers or chills.  Does admit that she tried treating it by looking at also concerned that she had a cold sore and could have developed herpetic whitlow.   Rash Associated symptoms: no fever and no shortness of breath        Home Medications Prior to Admission medications   Medication Sig Start Date End Date Taking? Authorizing Provider  doxycycline (VIBRAMYCIN) 100 MG capsule Take 1 capsule (100 mg total) by mouth 2 (two) times daily. 04/08/23  Yes Tommi Rumps, PA-C  predniSONE (DELTASONE) 20 MG tablet Take 1 tablet bid x 5 days 04/08/23  Yes Bridget Hartshorn L, PA-C  triamcinolone cream (KENALOG) 0.1 % Apply 1 Application topically 2 (two) times daily. 04/08/23  Yes Bridget Hartshorn L, PA-C  ondansetron (ZOFRAN-ODT) 4 MG disintegrating tablet Take 1 tablet (4 mg total) by mouth every 6 (six) hours as needed for nausea or vomiting. 07/20/22   Ward, Layla Maw, DO      Allergies    Latex    Review of Systems   Review of Systems  Constitutional:  Negative for fever.  HENT:  Negative for trouble swallowing.   Respiratory:  Negative for shortness of breath.   Cardiovascular:  Negative for chest pain.  Skin:  Positive for rash.  Neurological:  Negative for weakness and numbness.    Physical Exam Updated Vital Signs BP (!) 140/89 (BP Location: Left Arm)   Pulse 89   Temp 98.2 F (36.8 C) (Oral)   Resp 18   Ht 5\' 6"  (1.676 m)   Wt 86.2 kg   LMP 03/27/2023 (Exact Date)   SpO2 100%   Breastfeeding No   BMI 30.67  kg/m  Physical Exam Vitals and nursing note reviewed.  Constitutional:      General: She is not in acute distress.    Appearance: She is well-developed.  HENT:     Head: Normocephalic and atraumatic.  Eyes:     Conjunctiva/sclera: Conjunctivae normal.  Pulmonary:     Effort: Pulmonary effort is normal. No respiratory distress.  Abdominal:     Palpations: Abdomen is soft.  Musculoskeletal:        General: No swelling.     Cervical back: Neck supple.  Skin:    General: Skin is warm and dry.     Capillary Refill: Capillary refill takes less than 2 seconds.     Comments: 4 cm area of dry scaly cracked rash consistent with eczema on the ulnar aspect of right wrist.  Does have 2 small red purulent lesions of dorsum of hand and fifth digit.  No grouped vesicular lesions over erythematous base.  Neurological:     Mental Status: She is alert.  Psychiatric:        Mood and Affect: Mood normal.     ED Results / Procedures / Treatments   Labs (all labs ordered are listed, but only abnormal results are displayed) Labs Reviewed - No data to display  EKG  None  Radiology No results found.  Procedures Procedures    Medications Ordered in ED Medications - No data to display  ED Course/ Medical Decision Making/ A&P                                 Medical Decision Making Patient here for rash.  Eczema rash of right hand for the past few days.  She is afebrile nontachycardic hemodynamically stable overall well-appearing.  No evidence of TN/SJS.  No oral swelling or angioedema.  Patient does appear to have a secondary infection likely from licking the rash.  I do not see any evidence of herpetic lesions.  I will recommend steroid cream as well as oral and antibiotic.  She does have a recent reassuring A1c I have low suspicion of causing significant hyperglycemia.  Does have primary care she will follow-up with.           Final Clinical Impression(s) / ED Diagnoses Final  diagnoses:  Rash  Cellulitis, unspecified cellulitis site    Rx / DC Orders ED Discharge Orders          Ordered    doxycycline (VIBRAMYCIN) 100 MG capsule  2 times daily        04/08/23 0848    predniSONE (DELTASONE) 20 MG tablet        04/08/23 0848    triamcinolone cream (KENALOG) 0.1 %  2 times daily        04/08/23 0848              Christen Bame, PA-C 04/08/23 2951    Minna Antis, MD 04/08/23 [phone]

## 2023-06-26 ENCOUNTER — Emergency Department
Admission: EM | Admit: 2023-06-26 | Discharge: 2023-06-27 | Disposition: A | Discharge status: Other Acute Inpatient Hospital | Source: Home / Self Care | Attending: Emergency Medicine | Admitting: Emergency Medicine

## 2023-06-26 ENCOUNTER — Emergency Department

## 2023-06-26 ENCOUNTER — Other Ambulatory Visit: Payer: Self-pay

## 2023-06-26 DIAGNOSIS — F330 Major depressive disorder, recurrent, mild: Secondary | ICD-10-CM | POA: Insufficient documentation

## 2023-06-26 DIAGNOSIS — F4310 Post-traumatic stress disorder, unspecified: Secondary | ICD-10-CM | POA: Insufficient documentation

## 2023-06-26 DIAGNOSIS — F909 Attention-deficit hyperactivity disorder, unspecified type: Secondary | ICD-10-CM | POA: Insufficient documentation

## 2023-06-26 DIAGNOSIS — R3121 Asymptomatic microscopic hematuria: Secondary | ICD-10-CM | POA: Insufficient documentation

## 2023-06-26 DIAGNOSIS — F329 Major depressive disorder, single episode, unspecified: Secondary | ICD-10-CM | POA: Diagnosis not present

## 2023-06-26 DIAGNOSIS — L03113 Cellulitis of right upper limb: Secondary | ICD-10-CM | POA: Insufficient documentation

## 2023-06-26 DIAGNOSIS — L309 Dermatitis, unspecified: Secondary | ICD-10-CM | POA: Insufficient documentation

## 2023-06-26 DIAGNOSIS — F419 Anxiety disorder, unspecified: Secondary | ICD-10-CM | POA: Insufficient documentation

## 2023-06-26 DIAGNOSIS — F341 Dysthymic disorder: Secondary | ICD-10-CM | POA: Diagnosis not present

## 2023-06-26 DIAGNOSIS — L0390 Cellulitis, unspecified: Secondary | ICD-10-CM

## 2023-06-26 LAB — CBC WITH DIFFERENTIAL/PLATELET
Abs Immature Granulocytes: 0.03 10*3/uL (ref 0.00–0.07)
Basophils Absolute: 0 10*3/uL (ref 0.0–0.1)
Basophils Relative: 0 %
Eosinophils Absolute: 0.2 10*3/uL (ref 0.0–0.5)
Eosinophils Relative: 2 %
HCT: 43 % (ref 36.0–46.0)
Hemoglobin: 14.1 g/dL (ref 12.0–15.0)
Immature Granulocytes: 0 %
Lymphocytes Relative: 19 %
Lymphs Abs: 1.8 10*3/uL (ref 0.7–4.0)
MCH: 29.4 pg (ref 26.0–34.0)
MCHC: 32.8 g/dL (ref 30.0–36.0)
MCV: 89.8 fL (ref 80.0–100.0)
Monocytes Absolute: 0.6 10*3/uL (ref 0.1–1.0)
Monocytes Relative: 7 %
Neutro Abs: 6.8 10*3/uL (ref 1.7–7.7)
Neutrophils Relative %: 72 %
Platelets: 351 10*3/uL (ref 150–400)
RBC: 4.79 MIL/uL (ref 3.87–5.11)
RDW: 12.4 % (ref 11.5–15.5)
WBC: 9.4 10*3/uL (ref 4.0–10.5)
nRBC: 0 % (ref 0.0–0.2)

## 2023-06-26 LAB — COMPREHENSIVE METABOLIC PANEL
ALT: 21 U/L (ref 0–44)
AST: 25 U/L (ref 15–41)
Albumin: 4.3 g/dL (ref 3.5–5.0)
Alkaline Phosphatase: 70 U/L (ref 38–126)
Anion gap: 13 (ref 5–15)
BUN: 10 mg/dL (ref 6–20)
CO2: 21 mmol/L — ABNORMAL LOW (ref 22–32)
Calcium: 9.4 mg/dL (ref 8.9–10.3)
Chloride: 104 mmol/L (ref 98–111)
Creatinine, Ser: 0.88 mg/dL (ref 0.44–1.00)
GFR, Estimated: >60 mL/min (ref >60)
Glucose, Bld: 135 mg/dL — ABNORMAL HIGH (ref 70–99)
Potassium: 3.3 mmol/L — ABNORMAL LOW (ref 3.5–5.1)
Sodium: 138 mmol/L (ref 135–145)
Total Bilirubin: 0.9 mg/dL (ref 0.0–1.2)
Total Protein: 7.5 g/dL (ref 6.5–8.1)

## 2023-06-26 LAB — LACTIC ACID, PLASMA: Lactic Acid, Venous: 1.9 mmol/L (ref 0.5–1.9)

## 2023-06-26 MED ORDER — DOXYCYCLINE HYCLATE 100 MG PO TABS
100.0000 mg | ORAL_TABLET | Freq: Once | ORAL | Status: AC
  Administered 2023-06-26: 100 mg via ORAL
  Filled 2023-06-26: qty 1

## 2023-06-26 MED ORDER — LORAZEPAM 1 MG PO TABS
1.0000 mg | ORAL_TABLET | Freq: Once | ORAL | Status: AC
  Administered 2023-06-26: 1 mg via ORAL
  Filled 2023-06-26: qty 1

## 2023-06-26 NOTE — ED Provider Notes (Signed)
 [facility] Provider Note    Event Date/Time   First MD Initiated Contact with Patient 06/26/23 2323     (approximate)   History   Wound Infection (Right arm ) and Psychiatric Evaluation (/)   HPI  Jill Aguilar is a 33 y.o. female with history of ADHD, anxiety, depression, eczema who presents to the emergency department with 2 separate complaints.  Patient reports that she has had an eruption of eczema to her right wrist and forearm and now has redness and warmth to this area and is concerned it is infected.  States she has been on doxycycline previously with good relief and uses triamcinolone cream.  No fevers.  Not a diabetic.  Patient also complains of feeling very anxious and states she will suddenly become very angry and impulsive.  She states this is not like her.  She states she does not know what is going on.  She is requesting to talk to a psychiatrist.  She denies SI, HI.  She also reports intermittently seeing spots, colors and shapes in her vision but denies any other vision changes including vision loss, eye pain, blurry vision, loss of vision.  No other hallucinations.  She denies any drug or alcohol use.   History provided by patient.    Past Medical History:  Diagnosis Date   Anxiety    Depression    Heart murmur    childhood   Hx of chlamydia infection    Nausea    Pregnancy 15 weeks as of 05-07-17   Vitamin D deficiency     Past Surgical History:  Procedure Laterality Date   CESAREAN SECTION     fetal intolerance   LAPAROSCOPIC CHOLECYSTECTOMY SINGLE PORT N/A 05/17/2017   Procedure: LAPAROSCOPIC CHOLECYSTECTOMY SINGLE SITE;  Surgeon: Karie Soda, MD;  Location: [facility];  Service: General;  Laterality: N/A;   STERIOD INJECTION  05/17/2017   Procedure: STEROID INJECTION OF INCISION WITH KENALOG 40MG ;  Surgeon: Karie Soda, MD;  Location: [facility];  Service: General;;   WISDOM TOOTH EXTRACTION Bilateral 10/2002     MEDICATIONS:  Prior to Admission medications   Medication Sig Start Date End Date Taking? Authorizing Provider  doxycycline (VIBRAMYCIN) 100 MG capsule Take 1 capsule (100 mg total) by mouth 2 (two) times daily. 04/08/23   Tommi Rumps, PA-C  ondansetron (ZOFRAN-ODT) 4 MG disintegrating tablet Take 1 tablet (4 mg total) by mouth every 6 (six) hours as needed for nausea or vomiting. 07/20/22   Jahmar Mckelvy, Layla Maw, DO  predniSONE (DELTASONE) 20 MG tablet Take 1 tablet bid x 5 days 04/08/23   Bridget Hartshorn L, PA-C  triamcinolone cream (KENALOG) 0.1 % Apply 1 Application topically 2 (two) times daily. 04/08/23   Tommi Rumps, PA-C    Physical Exam   Triage Vital Signs: ED Triage Vitals [06/26/23 1835]  Encounter Vitals Group     BP (!) 152/88     Systolic BP Percentile      Diastolic BP Percentile      Pulse Rate (!) 130     Resp 20     Temp 98 F (36.7 C)     Temp Source Oral     SpO2 98 %     Weight      Height      Head Circumference      Peak Flow      Pain Score 7     Pain Loc  Pain Education      Exclude from Growth Chart     Most recent vital signs: Vitals:   06/26/23 2327 06/27/23 0801  BP: (!) 133/91 (!) 133/59  Pulse: (!) 111 87  Resp: 20 16  Temp:  97.9 F (36.6 C)  SpO2: 100% 98%    CONSTITUTIONAL: Alert, responds appropriately to questions. Well-appearing; well-nourished HEAD: Normocephalic, atraumatic EYES: Conjunctivae clear, pupils appear equal, sclera nonicteric, extraocular movements intact, no hyphema hypopyon, no drainage ENT: normal nose; moist mucous membranes NECK: Supple, normal ROM CARD: RRR; S1 and S2 appreciated RESP: Normal chest excursion without splinting or tachypnea; breath sounds clear and equal bilaterally; no wheezes, no rhonchi, no rales, no hypoxia or respiratory distress, speaking full sentences ABD/GI: Non-distended; soft, non-tender, no rebound, no guarding, no peritoneal signs BACK: The back appears normal EXT:  Normal ROM in all joints; no deformity noted, no edema, 2+ radial pulses SKIN: Normal color for age and race; warm; eczematous scaly rash noted to the right wrist and forearm with mild superimposed redness, warmth without drainage, fluctuance or induration NEURO: Moves all extremities equally, normal speech, normal sensation, no facial asymmetry PSYCH: Anxious, tearful.  Denies SI or HI.  Not responding to internal stimuli.   ED Results / Procedures / Treatments   LABS: (all labs ordered are listed, but only abnormal results are displayed) Labs Reviewed  COMPREHENSIVE METABOLIC PANEL - Abnormal; Notable for the following components:      Result Value   Potassium 3.3 (*)    CO2 21 (*)    Glucose, Bld 135 (*)    All other components within normal limits  URINALYSIS, W/ REFLEX TO CULTURE (INFECTION SUSPECTED) - Abnormal; Notable for the following components:   Color, Urine YELLOW (*)    APPearance HAZY (*)    Hgb urine dipstick LARGE (*)    Ketones, ur 5 (*)    All other components within normal limits  URINE DRUG SCREEN, QUALITATIVE ([facility] ONLY) - Abnormal; Notable for the following components:   Amphetamines, Ur Screen POSITIVE (*)    Cannabinoid 50 Ng, Ur [place] POSITIVE (*)    All other components within normal limits  URINE CULTURE  LACTIC ACID, PLASMA  CBC WITH DIFFERENTIAL/PLATELET  MAGNESIUM  TSH  T4, FREE  HCG, QUANTITATIVE, PREGNANCY  POC URINE PREG, ED     EKG:  RADIOLOGY: My personal review and interpretation of imaging: CT head unremarkable.  I have personally reviewed all radiology reports.   CT HEAD WO CONTRAST ( ) Result Date: 06/27/2023 CLINICAL DATA:  Vision changes EXAM: CT HEAD WITHOUT CONTRAST TECHNIQUE: Contiguous axial images were obtained from the base of the skull through the vertex without intravenous contrast. RADIATION DOSE REDUCTION: This exam was performed according to the departmental dose-optimization program which includes automated exposure  control, adjustment of the mA and/or kV according to patient size and/or use of iterative reconstruction technique. COMPARISON:  None Available. FINDINGS: Brain: No mass,hemorrhage or extra-axial collection. Normal appearance of the parenchyma and CSF spaces. Vascular: No hyperdense vessel or unexpected vascular calcification. Skull: The visualized skull base, calvarium and extracranial soft tissues are normal. Sinuses/Orbits: No fluid levels or advanced mucosal thickening of the visualized paranasal sinuses. No mastoid or middle ear effusion. Normal orbits. Other: None. IMPRESSION: Normal head CT. Electronically Signed   By: Deatra Robinson M.D.   On: 06/27/2023 01:05     PROCEDURES:  Critical Care performed: No    Procedures    IMPRESSION / MDM / ASSESSMENT AND PLAN /  ED COURSE  I reviewed the triage vital signs and the nursing notes.    Patient here with complaints of eczema with concerns for superimposed cellulitis and also concerns for anxiety, intermittent mania, seeing which she describes as visual hallucinations of shapes, spots and colors.    DIFFERENTIAL DIAGNOSIS (includes but not limited to):   Eczema, cellulitis, no sign of abscess, compartment syndrome, gout, septic arthritis, arterial obstruction or DVT  Anxiety, mania, psychosis, low suspicion for intracranial hemorrhage, stroke or meningitis.  Differential does include hyperthyroidism, substance use disorder.   Patient's presentation is most consistent with acute presentation with potential threat to life or bodily function.   PLAN: Will start patient on doxycycline and clobetasol ointment.  Labs today show no leukocytosis, normal lactic.  No sign of septic arthritis, arterial obstruction, DVT, abscess.  Patient also reports seeing colors, shapes and spots in her vision.  Her vision is currently normal and she is otherwise neurologically intact.  She scribes these as visual hallucinations.  This may be related to her  psychiatric illness but will obtain head CT to rule out other intracranial abnormality.  She denies any headache, head injury.  She states that she feels she needs to see psychiatry.  She describes episodes of being manic.  Will give Ativan for symptomatic relief.   MEDICATIONS GIVEN IN ED: Medications  clobetasol ointment (TEMOVATE) 0.05 % (has no administration in time range)  doxycycline (VIBRA-TABS) tablet 100 mg (has no administration in time range)  doxycycline (VIBRA-TABS) tablet 100 mg (100 mg Oral Given 06/26/23 2352)  LORazepam (ATIVAN) tablet 1 mg (1 mg Oral Given 06/26/23 2352)     ED COURSE: Patient's labs show normal hemoglobin, no electrolyte derangement, normal LFTs.  Normal thyroid function test.  Negative pregnancy test.  Drug screen positive for amphetamines but patient states she is on medication for ADHD.  Also positive for cannabinoids.  Urine does show a large amount of red blood cells and some white blood cells but no bacteria, leukocytes or nitrites.  She denies any urinary symptoms, flank or abdominal pain.  It appears she has had hematuria previously on multiple prior urines.  She is not on her menstrual cycle.  Recommended follow-up with PCP for further evaluation for this.  Will add on urine culture but given no symptoms I have low suspicion that she has a UTI.  Also low suspicion for kidney stone.  This can be worked up further as an outpatient.   CONSULTS: Patient medically cleared at this time.  TTS and psychiatry consulted.  They have recommended inpatient psychiatric admission.   OUTSIDE RECORDS REVIEWED: Reviewed last internal medicine visit for eczema on 08/02/2022.       FINAL CLINICAL IMPRESSION(S) / ED DIAGNOSES   Final diagnoses:  Eczema, unspecified type  Cellulitis, unspecified cellulitis site  Anxiety  Asymptomatic microscopic hematuria     Rx / DC Orders   ED Discharge Orders          Ordered    clobetasol ointment (TEMOVATE) 0.05 %   2 times daily        06/27/23 4098             Note:  This document was prepared using Dragon voice recognition software and may include unintentional dictation errors.   Hilmar Moldovan, Layla Maw, DO 06/27/23 [phone]

## 2023-06-26 NOTE — ED Triage Notes (Addendum)
 Pt to ED via POV from home. Pt reports right arm infection is back. Pt not currently on antibiotics. Pt reports also feeling like she is going to have a mental breakdown and feels like she is vibrating out of her body. Pt reports increased anxiety. Pt denies SI/HI. Pt reporting wanting to be seen by psychiatry team today.

## 2023-06-27 ENCOUNTER — Encounter: Payer: Self-pay | Admitting: Psychiatry

## 2023-06-27 ENCOUNTER — Other Ambulatory Visit: Payer: Self-pay

## 2023-06-27 ENCOUNTER — Inpatient Hospital Stay
Admission: AD | Admit: 2023-06-27 | Discharge: 2023-06-30 | DRG: 881 | Disposition: A | Discharge status: Home / Self Care | Source: Intra-hospital | Attending: Psychiatry | Admitting: Psychiatry

## 2023-06-27 DIAGNOSIS — F1210 Cannabis abuse, uncomplicated: Secondary | ICD-10-CM | POA: Diagnosis present

## 2023-06-27 DIAGNOSIS — Z833 Family history of diabetes mellitus: Secondary | ICD-10-CM

## 2023-06-27 DIAGNOSIS — Z8261 Family history of arthritis: Secondary | ICD-10-CM

## 2023-06-27 DIAGNOSIS — F329 Major depressive disorder, single episode, unspecified: Secondary | ICD-10-CM | POA: Diagnosis present

## 2023-06-27 DIAGNOSIS — L309 Dermatitis, unspecified: Secondary | ICD-10-CM | POA: Diagnosis present

## 2023-06-27 DIAGNOSIS — Z825 Family history of asthma and other chronic lower respiratory diseases: Secondary | ICD-10-CM | POA: Diagnosis not present

## 2023-06-27 DIAGNOSIS — F4310 Post-traumatic stress disorder, unspecified: Secondary | ICD-10-CM | POA: Diagnosis present

## 2023-06-27 DIAGNOSIS — Z62810 Personal history of physical and sexual abuse in childhood: Secondary | ICD-10-CM

## 2023-06-27 DIAGNOSIS — Z8249 Family history of ischemic heart disease and other diseases of the circulatory system: Secondary | ICD-10-CM | POA: Diagnosis not present

## 2023-06-27 DIAGNOSIS — F341 Dysthymic disorder: Secondary | ICD-10-CM | POA: Diagnosis present

## 2023-06-27 DIAGNOSIS — Z91040 Latex allergy status: Secondary | ICD-10-CM | POA: Diagnosis not present

## 2023-06-27 DIAGNOSIS — Z79899 Other long term (current) drug therapy: Secondary | ICD-10-CM | POA: Diagnosis not present

## 2023-06-27 DIAGNOSIS — R3121 Asymptomatic microscopic hematuria: Secondary | ICD-10-CM | POA: Diagnosis present

## 2023-06-27 DIAGNOSIS — F909 Attention-deficit hyperactivity disorder, unspecified type: Secondary | ICD-10-CM | POA: Diagnosis present

## 2023-06-27 DIAGNOSIS — F419 Anxiety disorder, unspecified: Secondary | ICD-10-CM | POA: Diagnosis present

## 2023-06-27 DIAGNOSIS — Z83438 Family history of other disorder of lipoprotein metabolism and other lipidemia: Secondary | ICD-10-CM | POA: Diagnosis not present

## 2023-06-27 DIAGNOSIS — F330 Major depressive disorder, recurrent, mild: Secondary | ICD-10-CM | POA: Diagnosis not present

## 2023-06-27 DIAGNOSIS — F320 Major depressive disorder, single episode, mild: Secondary | ICD-10-CM | POA: Diagnosis not present

## 2023-06-27 LAB — URINALYSIS, W/ REFLEX TO CULTURE (INFECTION SUSPECTED)
Bacteria, UA: NONE SEEN
Bilirubin Urine: NEGATIVE
Glucose, UA: NEGATIVE mg/dL
Ketones, ur: 5 mg/dL — AB
Leukocytes,Ua: NEGATIVE
Nitrite: NEGATIVE
Protein, ur: NEGATIVE mg/dL
RBC / HPF: >50 RBC/hpf (ref 0–5)
Specific Gravity, Urine: 1.018 (ref 1.005–1.030)
pH: 6 (ref 5.0–8.0)

## 2023-06-27 LAB — URINE DRUG SCREEN, QUALITATIVE (ARMC ONLY)
Amphetamines, Ur Screen: POSITIVE — AB
Barbiturates, Ur Screen: NOT DETECTED
Benzodiazepine, Ur Scrn: NOT DETECTED
Cannabinoid 50 Ng, Ur ~~LOC~~: POSITIVE — AB
Cocaine Metabolite,Ur ~~LOC~~: NOT DETECTED
MDMA (Ecstasy)Ur Screen: NOT DETECTED
Methadone Scn, Ur: NOT DETECTED
Opiate, Ur Screen: NOT DETECTED
Phencyclidine (PCP) Ur S: NOT DETECTED
Tricyclic, Ur Screen: NOT DETECTED

## 2023-06-27 LAB — POC URINE PREG, ED: Preg Test, Ur: NEGATIVE

## 2023-06-27 LAB — TSH: TSH: 1.333 u[IU]/mL (ref 0.350–4.500)

## 2023-06-27 LAB — MAGNESIUM: Magnesium: 2 mg/dL (ref 1.7–2.4)

## 2023-06-27 LAB — HCG, QUANTITATIVE, PREGNANCY: hCG, Beta Chain, Quant, S: <1 m[IU]/mL (ref <5)

## 2023-06-27 LAB — T4, FREE: Free T4: 0.83 ng/dL (ref 0.61–1.12)

## 2023-06-27 MED ORDER — HALOPERIDOL LACTATE 5 MG/ML IJ SOLN: 10.0000 mg | Freq: Three times a day (TID) | INTRAMUSCULAR | Status: DC | PRN

## 2023-06-27 MED ORDER — ACETAMINOPHEN 325 MG PO TABS: 650.0000 mg | ORAL_TABLET | Freq: Four times a day (QID) | ORAL | Status: DC | PRN

## 2023-06-27 MED ORDER — CLOBETASOL PROPIONATE 0.05 % EX OINT
TOPICAL_OINTMENT | Freq: Two times a day (BID) | CUTANEOUS | Status: DC
  Filled 2023-06-27: qty 15

## 2023-06-27 MED ORDER — HALOPERIDOL LACTATE 5 MG/ML IJ SOLN: 5.0000 mg | Freq: Three times a day (TID) | INTRAMUSCULAR | Status: DC | PRN

## 2023-06-27 MED ORDER — TRAZODONE HCL 50 MG PO TABS
50.0000 mg | ORAL_TABLET | Freq: Every evening | ORAL | Status: DC | PRN
  Administered 2023-06-28: 50 mg via ORAL
  Filled 2023-06-27: qty 1

## 2023-06-27 MED ORDER — DIPHENHYDRAMINE HCL 50 MG/ML IJ SOLN: 50.0000 mg | Freq: Three times a day (TID) | INTRAMUSCULAR | Status: DC | PRN

## 2023-06-27 MED ORDER — DOXYCYCLINE HYCLATE 100 MG PO TABS
100.0000 mg | ORAL_TABLET | Freq: Two times a day (BID) | ORAL | Status: DC
  Administered 2023-06-27: 100 mg via ORAL
  Filled 2023-06-27 (×2): qty 1

## 2023-06-27 MED ORDER — HYDROXYZINE HCL 25 MG PO TABS
25.0000 mg | ORAL_TABLET | Freq: Three times a day (TID) | ORAL | Status: DC | PRN
Start: 2023-06-27 — End: 2023-06-30
  Administered 2023-06-27 – 2023-06-28 (×2): 25 mg via ORAL
  Filled 2023-06-27 (×2): qty 1

## 2023-06-27 MED ORDER — LORAZEPAM 2 MG/ML IJ SOLN: 2.0000 mg | Freq: Three times a day (TID) | INTRAMUSCULAR | Status: DC | PRN

## 2023-06-27 MED ORDER — CLOBETASOL PROPIONATE 0.05 % EX OINT
TOPICAL_OINTMENT | Freq: Two times a day (BID) | CUTANEOUS | Status: DC
Start: 2023-06-27 — End: 2023-06-30
  Filled 2023-06-27: qty 15

## 2023-06-27 MED ORDER — CLOBETASOL PROPIONATE 0.05 % EX OINT: 1.0000 | TOPICAL_OINTMENT | Freq: Two times a day (BID) | CUTANEOUS | 1 refills | Status: DC

## 2023-06-27 MED ORDER — DIPHENHYDRAMINE HCL 25 MG PO CAPS: 50.0000 mg | ORAL_CAPSULE | Freq: Three times a day (TID) | ORAL | Status: DC | PRN

## 2023-06-27 MED ORDER — ALUM & MAG HYDROXIDE-SIMETH 200-200-20 MG/5ML PO SUSP: 30.0000 mL | ORAL | Status: DC | PRN

## 2023-06-27 MED ORDER — MAGNESIUM HYDROXIDE 400 MG/5ML PO SUSP: 30.0000 mL | Freq: Every day | ORAL | Status: DC | PRN

## 2023-06-27 MED ORDER — DOXYCYCLINE HYCLATE 100 MG PO TABS
100.0000 mg | ORAL_TABLET | Freq: Two times a day (BID) | ORAL | Status: DC
  Administered 2023-06-27 – 2023-06-30 (×6): 100 mg via ORAL
  Filled 2023-06-27 (×6): qty 1

## 2023-06-27 MED ORDER — HALOPERIDOL 5 MG PO TABS: 5.0000 mg | ORAL_TABLET | Freq: Three times a day (TID) | ORAL | Status: DC | PRN

## 2023-06-27 NOTE — ED Notes (Signed)
 Hospital meal provided, pt tolerated w/o complaints.  Waste discarded appropriately.

## 2023-06-27 NOTE — Plan of Care (Signed)
  Problem: Education: Goal: Knowledge of [facility] Education information/materials will improve Outcome: Progressing   Problem: Education: Goal: Emotional status will improve Outcome: Progressing   

## 2023-06-27 NOTE — Group Note (Signed)
 [facility] [place] LCSW Group Therapy Note   Group Date: 06/27/2023 Start Time: 1330 End Time: 1440   Type of Therapy/Topic:  Group Therapy:  Emotion Regulation  Participation Level:  Active   Mood:  Description of Group:    The purpose of this group is to assist patients in learning to regulate negative emotions and experience positive emotions. Patients will be guided to discuss ways in which they have been vulnerable to their negative emotions. These vulnerabilities will be juxtaposed with experiences of positive emotions or situations, and patients challenged to use positive emotions to combat negative ones. Special emphasis will be placed on coping with negative emotions in conflict situations, and patients will process healthy conflict resolution skills.  Therapeutic Goals: Patient will identify two positive emotions or experiences to reflect on in order to balance out negative emotions:  Patient will label two or more emotions that they find the most difficult to experience:  Patient will be able to demonstrate positive conflict resolution skills through discussion or role plays:   Summary of Patient Progress: Patient actively participated in group, sharing personal experiences in navigating complex situations while demonstrating self-restraint, awareness, and effective communication. They were engaged, receptive to feedback, and contributed to fostering a supportive environment that encouraged peers to share and learn.     Therapeutic Modalities:   Cognitive Behavioral Therapy Feelings Identification Dialectical Behavioral Therapy   Lowry Ram, LCSW

## 2023-06-27 NOTE — Progress Notes (Signed)
 33 year old female patient admitted for major depressive disorder. Patient appears sad but cooperative with admission assessment. Patient states the reason for admission " I don't feel like I am myself. I feel vibrating to my skin.I couldn't calm down by nothing.I see dark shadows on the wall at times." Patient lives with her 4 kids and boy friend for 14 years. Patient denies SI,HI and AVH. 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 Skin assessment and body search done. No contraband found. Noted eczema on right hand. Oriented to unit and made comfortable. Support and encouragement given.

## 2023-06-27 NOTE — ED Notes (Signed)
 Pt home med (flexeril) taken to pharmacy.

## 2023-06-27 NOTE — Consult Note (Signed)
 [facility] Psychiatric Consult Initial  Patient Name: .Jill Aguilar  MRN: [id number]  DOB: 02-28-1991  Consult Order details:  Orders (From admission, onward)     Start     Ordered   06/27/23 0101  CONSULT TO CALL ACT TEAM       Ordering Provider: Raelyn Number, DO  Provider:  (Not yet assigned)  Question:  Reason for Consult?  Answer:  Psych consult   06/27/23 0101   06/27/23 0101  IP CONSULT TO PSYCHIATRY       Ordering Provider: Raelyn Number, DO  Provider:  (Not yet assigned)  Question Answer Comment  Consult Timeframe STAT - requires a response within one hour   STAT timeframe requires provider to provider communication, has the provider to provider communication been completed Yes   Reason for Consult? severe anxiety, possible mania   Contact phone number where the requesting provider can be reached [phone]      06/27/23 0101             Mode of Visit: Tele-visit Virtual Statement:TELE PSYCHIATRY ATTESTATION & CONSENT As the provider for this telehealth consult, I attest that I verified the patient's identity using two separate identifiers, introduced myself to the patient, provided my credentials, disclosed my location, and performed this encounter via a HIPAA-compliant, real-time, face-to-face, two-way, interactive audio and video platform and with the full consent and agreement of the patient (or guardian as applicable.) Patient physical location: [facility]. Telehealth provider physical location: home office in state of [place].   Video start time:   Video end time:      Psychiatry Consult Evaluation  Service Date: June 27, 2023 LOS:  LOS: 0 days  Chief Complaint "I don't feel like my self"  Primary Psychiatric Diagnoses  MDD with psychotic features 2.  Anxiety  Assessment  Jill Aguilar is a 33 y.o. female admitted: Presented to the EDfor 06/26/2023 11:18 PM for worsening depression. She carries the psychiatric  diagnoses of dysthymic disorder, PTSD, and ADHD.   Her current presentation of anhedonia,fatigue,irritable, not eating,hallucinations is most consistent with MDD with psychotic features.  She meets criteria for inpatient based on persistent low mood, lack of self-care, significant distress, and visual hallucinations.  Patient denies use of outpatient psychotropic medications. On initial examination, patient appears distressed, disheveled, and reports active visual hallucinations. She is experiencing significant psychosocial stressors contributing to her worsening symptoms. Please see plan below for detailed recommendations.   Diagnoses:  Active Hospital problems: Active Problems:   * No active hospital problems. *    Plan   ## Psychiatric Medication Recommendations:  Consider antipsychotic for visual hallucinations. Also a mood stabilizer.  ## Medical Decision Making Capacity: Not specifically addressed in this encounter   ## Disposition:-- We recommend inpatient psychiatric hospitalization when medically cleared. Patient is under voluntary admission status at this time; please IVC if attempts to leave hospital.  ## Behavioral / Environmental: - No specific recommendations at this time.     ## Safety and Observation Level:  - Based on my clinical evaluation, I estimate the patient to be at minimal risk of self harm in the current setting. - At this time, we recommend  routine. This decision is based on my review of the chart including patient's history and current presentation, interview of the patient, mental status examination, and consideration of suicide risk including evaluating suicidal ideation, plan, intent, suicidal or self-harm behaviors, risk factors, and protective factors. This judgment  is based on our ability to directly address suicide risk, implement suicide prevention strategies, and develop a safety plan while the patient is in the clinical setting. Please contact our team  if there is a concern that risk level has changed.  CSSR Risk Category:C-SSRS RISK CATEGORY: No Risk  Suicide Risk Assessment: Patient has following modifiable risk factors for suicide: under treated depression , which we are addressing by an inpatient admission to stabilize mood. Patient has following non-modifiable or demographic risk factors for suicide: separation or divorce Patient has the following protective factors against suicide: Access to outpatient mental health care and Minor children in the home  Thank you for this consult request. Recommendations have been communicated to the primary team.  We will recommend inpatient at this time.   De Burrs, NP       History of Present Illness  Relevant Aspects of Hospital ED Course:  Admitted on 06/26/2023 for wound infection. They requested a psychiatric consultation.   Patient Report:  Jill Aguilar. Jill Aguilar a 33 year old female, presents to the ED for worsening depression and anxiety. She carries psychiatric diagnoses of PTSD, Dysthymic Disorder, and ADHD. The patient reports not taking care of herself, including not bathing, eating, or brushing her teeth. She states, "I don't feel like myself." She endorses visual hallucinations, describing seeing white polka dots, black blobs that move, and "lights going off," which occur during the daytime.  The patient reports significant distress regarding her personal life. She has four children and has been in a 14-year relationship that she wants to end, but her partner refuses to leave. She expresses feelings of wanting to leave everyone, including her partner and children. Additionally, she states, "I need help. When I get going, I can't calm down."  Psych ROS:  Depression: yes Anxiety:  yes Mania (lifetime and current): unknown Psychosis: (lifetime and current): visual  Review of Systems  Constitutional: Negative.   HENT: Negative.    Eyes: Negative.   Respiratory: Negative.     Cardiovascular: Negative.   Gastrointestinal: Negative.   Genitourinary: Negative.   Musculoskeletal: Negative.   Skin: Negative.   Neurological: Negative.   Psychiatric/Behavioral:  Positive for depression and hallucinations.      Psychiatric and Social History  Psychiatric History:  Information collected from chart and patient  Prev Dx/Sx: PTSD, ADHD, Dysthymia Current Psych Provider: none Home Meds (current): none Previous Med Trials: none Therapy: none  Prior Psych Hospitalization: none  Prior Self Harm: none Prior Violence: none  Family Psych History: none Family Hx suicide: none  Social History:  Occupational Hx: Education administrator Hx: none Living Situation: Lives with boyfriend and 4 children Spiritual Hx: unknown Access to weapons/lethal means: No   Substance History Alcohol: none  Type of alcohol none Last Drink n/a Number of drinks per day none History of alcohol withdrawal seizures none History of DT's n/a Tobacco: none Illicit drugs: CBD Prescription drug abuse: none Rehab hx: none  Exam Findings  Physical Exam:  Vital Signs:  Temp:  [98 F (36.7 C)] 98 F (36.7 C) (03/11 1835) Pulse Rate:  [111-130] 111 (03/11 2327) Resp:  [20] 20 (03/11 2327) BP: (133-152)/(88-91) 133/91 (03/11 2327) SpO2:  [98 %-100 %] 100 % (03/11 2327) Blood pressure (!) 133/91, pulse (!) 111, temperature 98 F (36.7 C), temperature source Oral, resp. rate 20, last menstrual period 06/15/2023, SpO2 100%. There is no height or weight on file to calculate BMI.  Physical Exam HENT:     Head: Normocephalic.  Nose: Nose normal.     Mouth/Throat:     Pharynx: Oropharynx is clear.  Cardiovascular:     Rate and Rhythm: Normal rate.     Pulses: Normal pulses.  Pulmonary:     Effort: Pulmonary effort is normal.  Musculoskeletal:        General: Normal range of motion.     Cervical back: Normal range of motion.  Skin:    General: Skin is dry.  Neurological:      General: No focal deficit present.     Mental Status: She is alert.     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  Family History:  The patient's family history includes Arthritis in her maternal grandfather and mother; Asthma in her brother and mother; Birth defects in her brother; Cancer in her maternal grandmother; Diabetes in her maternal grandmother; Hyperlipidemia in her maternal grandmother; Hypertension in her maternal grandmother.  Medical History: Past Medical History:  Diagnosis Date   Anxiety    Depression    Heart murmur    childhood   Hx of chlamydia infection    Nausea    Pregnancy 15 weeks as of 05-07-17   Vitamin D deficiency     Surgical History: Past Surgical History:  Procedure Laterality Date   CESAREAN SECTION     fetal intolerance   LAPAROSCOPIC CHOLECYSTECTOMY SINGLE PORT N/A 05/17/2017   Procedure: LAPAROSCOPIC CHOLECYSTECTOMY SINGLE SITE;  Surgeon: Karie Soda, MD;  Location: [facility];  Service: General;  Laterality: N/A;   STERIOD INJECTION  05/17/2017   Procedure: STEROID INJECTION OF INCISION WITH KENALOG 40MG ;  Surgeon: Karie Soda, MD;  Location: [facility];  Service: General;;   WISDOM TOOTH  EXTRACTION Bilateral 10/2002     Medications:  No current facility-administered medications for this encounter.  Current Outpatient Medications:    doxycycline (VIBRAMYCIN) 100 MG capsule, Take 1 capsule (100 mg total) by mouth 2 (two) times daily., Disp: 14 capsule, Rfl: 0   ondansetron (ZOFRAN-ODT) 4 MG disintegrating tablet, Take 1 tablet (4 mg total) by mouth every 6 (six) hours as needed for nausea or vomiting., Disp: 20 tablet, Rfl: 0   predniSONE (DELTASONE) 20 MG tablet, Take 1 tablet bid x 5 days, Disp: 10 tablet, Rfl: 0   triamcinolone cream (KENALOG) 0.1 %, Apply 1 Application topically 2 (two) times daily., Disp: 30 g, Rfl: 1  Allergies: Allergies  Allergen Reactions   Latex Itching    Pt itching post op where EKG leads, BP cuff, and tape touched her skin    De Burrs, NP

## 2023-06-27 NOTE — [facility] Suicide Risk Assessment (Signed)
 [facility] INPATIENT:  Family/Significant Other Suicide Prevention Education  Suicide Prevention Education:  Patient Refusal for Family/Significant Other Suicide Prevention Education: The patient Jill Aguilar has refused to provide written consent for family/significant other to be provided Family/Significant Other Suicide Prevention Education during admission and/or prior to discharge.  Physician notified.  SPE completed with pt, as pt refused to consent to family contact. SPI pamphlet provided to pt and pt was encouraged to share information with support network, ask questions, and talk about any concerns relating to SPE. Pt denies access to guns/firearms and verbalized understanding of information provided. Mobile Crisis information also provided to pt.  Glenis Smoker 06/27/2023, 2:18 PM
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
## 2023-06-27 NOTE — ED Notes (Signed)
Vol /psych consult pending 

## 2023-06-27 NOTE — [facility] Group Notes (Signed)
 [facility] Group Notes:  (Nursing/MHT/Case Management/Adjunct)  Date:  06/27/2023  Time:  8:56 PM  Type of Therapy:  Group Therapy  Participation Level:  Active  Participation Quality:  Appropriate and Attentive  Affect:  Appropriate  Cognitive:  Alert and Appropriate  Insight:  Appropriate and Good  Engagement in Group:  Engaged  Modes of Intervention:  Problem-solving  Summary of Progress/Problems: Did not experience any problems during group Tacy Dura 06/27/2023, 8:56 PM

## 2023-06-27 NOTE — ED Notes (Addendum)
 Moved to [facility] via wheelchair with EDT and Security. Pt alert and oriented X4, cooperative, RR even and unlabored, color WNL. Pt in NAD.

## 2023-06-27 NOTE — ED Notes (Addendum)
 Pt dressed out into hospital provided burgundy scrubs with this tech and Josslyn, RN in the rm. Pt belongings consist of: one orange hoodie, gray shirt, black bra, white crocs, gray leggings, black panties, white charger, one tan purse with a keyboard, one laptop with a fully intact screen, one cell phone with a fully intact screen, one car key, one tan wallet with loose change, multiple cards, and one black charger. Pt belongings placed into two pt belongings bag and labeled with pt name. Pt calm and cooperative while dressing out.   Pt has a bottle of cyclobenzaprine 10mg  with 17 pills inside of bottle counted by Beather Arbour, RN.   Pt has two nipple rings and a nose ring that she is unable to remove at this time.

## 2023-06-27 NOTE — ED Provider Notes (Signed)
 Emergency Medicine Observation Re-evaluation Note  Jill Aguilar is a 33 y.o. female, seen on rounds today.  Pt initially presented to the ED for complaints of Wound Infection (Right arm ) and Psychiatric Evaluation (/)  Currently, the patient is calm, no acute complaints.  Physical Exam  Blood pressure (!) 133/59, pulse 87, temperature 97.9 F (36.6 C), temperature source Oral, resp. rate 16, last menstrual period 06/15/2023, SpO2 98%. Physical Exam General: NAD Lungs: CTAB Psych: not agitated  ED Course / MDM  EKG:    I have reviewed the labs performed to date as well as medications administered while in observation.  Recent changes in the last 24 hours include no acute events overnight.    Plan  Current plan is for psych admission. Patient is not under full IVC at this time.   Sharman Cheek, MD 06/27/23 [id number]

## 2023-06-27 NOTE — Tx Team (Signed)
 Initial Treatment Plan 06/27/2023 3:11 PM Jill Aguilar RUE:[id number]    PATIENT STRESSORS: Marital or family conflict   Substance abuse   Traumatic event     PATIENT STRENGTHS: Average or above average intelligence  Capable of independent living  Communication skills  Motivation for treatment/growth  Physical Health  Work skills    PATIENT IDENTIFIED PROBLEMS: Overwhelmed with family and work  Poor family support                   DISCHARGE CRITERIA:  Ability to meet basic life and health needs Adequate post-discharge living arrangements Medical problems require only outpatient monitoring Motivation to continue treatment in a less acute level of care Verbal commitment to aftercare and medication compliance  PRELIMINARY DISCHARGE PLAN: Attend aftercare/continuing care group Return to previous living arrangement Return to previous work or school arrangements  PATIENT/FAMILY INVOLVEMENT: This treatment plan has been presented to and reviewed with the patient, Jill Aguilar, and/or family member,   The patient and family have been given the opportunity to ask questions and make suggestions.  Leonarda Salon, RN 06/27/2023, 3:11 PM

## 2023-06-27 NOTE — BH Assessment (Addendum)
 Comprehensive Clinical Assessment (CCA) Note  06/27/2023 Jill Aguilar Jill Aguilar [id number] Recommendations for Services/Supports/Treatments: Psych NP Marzella Schlein. determined pt. meets psychiatric inpatient criteria. Jill Aguilar is a 33 y.o., Black, Non-Hispanic or Latino ethnicity, ENGLISH speaking female who presented ED voluntarily for an evaluation. Per triage note: Pt to ED via POV from home. Pt reports right arm infection is back. Pt not currently on antibiotics. Pt reports also feeling like she is going to have a mental breakdown and feels like she is vibrating out of her body. Pt reports increased anxiety. Pt denies SI/HI. Pt reporting wanting to be seen by psychiatry team today.     Since arrival to the ED, the patient has been calm and cooperative. The patient has not had any behavioral or aggressive episodes while in the psych ED. Pt has not required any emergency interventions. Upon assessment pt. was initially drowsy and reticent; however, pt. actively participated as the assessment progressed. When asked what'd brought pt. to the ED, the pt. stated, "A lot of anxiety". 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 Pt reported that she lives with her boyfriend of 14 years; however, the relationship is strained by frequent arguments. Pt expressed a desire to end the relationship; however, her boyfriend won't leave the home. Pt admits that she struggles with regulating her emotions once triggered. Pt reported that she is not taking care of herself as she forgets to eat and brush her teeth. Pt reported sleep issues, explaining that she falls asleep at about 1:30-2 am and getting up to take her 70-year-old to school by 7 am.  Pt endorsed having symptoms of depression for about one month, evidenced by a decrease in energy. Pt did report that she uses cannabis or CBD, daily. Pt was not responding to internal stimuli. Pt  had soft, coherent speech and thoughts were linear. Pt was oriented x4. Pt presented with relevant thought processes and slow psychomotor activity. Pt presented with a depressed, irritable mood; affect was flat.  The patient denied current SI, HI or AV/H. Pt's BAL is unremarkable/UDS + for amphetamine and cannabis.  Chief Complaint:  Chief Complaint  Patient presents with   Wound Infection    Right arm    Psychiatric Evaluation        Visit Diagnosis: PTSD ADHD MDD recurrent, mild  CCA Screening, Triage and Referral (STR)  Patient Reported Information How did you hear about [place]? No data recorded Referral name: No data recorded Referral phone number: No data recorded  Whom do you see for routine medical problems? No data recorded Practice/Facility Name: No data recorded Practice/Facility Phone Number: No data recorded Name of Contact: No data recorded Contact Number: No data recorded Contact Fax Number: No data recorded Prescriber Name: No data recorded Prescriber Address (if known): No data recorded  What Is the Reason for Your Visit/Call Today? No data recorded How Long Has This Been Causing You Problems? No data recorded What Do You Feel Would Help You the Most Today? No data recorded  Have You Recently Been in Any Inpatient Treatment (Hospital/Detox/Crisis Center/28-Day Program)? No data recorded Name/Location of Program/Hospital:No data recorded How Long Were You There? No data recorded When Were You Discharged? No data recorded  Have You Ever Received Services From [facility] Before? No data recorded Who Do You See at [facility]? No data recorded  Have You Recently Had Any Thoughts About Hurting Yourself?  No data recorded Are You Planning to Commit Suicide/Harm Yourself At This time? No data recorded  Have you Recently Had Thoughts About Hurting Someone Karolee Ohs? No data recorded Explanation: No data recorded  Have You Used Any Alcohol or Drugs in the Past 24 Hours?  No data recorded How Long Ago Did You Use Drugs or Alcohol? No data recorded What Did You Use and How Much? No data recorded  Do You Currently Have a Therapist/Psychiatrist? No data recorded Name of Therapist/Psychiatrist: No data recorded  Have You Been Recently Discharged From Any Office Practice or Programs? No data recorded Explanation of Discharge From Practice/Program: No data recorded    CCA Screening Triage Referral Assessment Type of Contact: No data recorded Is this Initial or Reassessment? No data recorded Date Telepsych consult ordered in [facility]:  No data recorded Time Telepsych consult ordered in [facility]:  No data recorded  Patient Reported Information Reviewed? No data recorded Patient Left Without Being Seen? No data recorded Reason for Not Completing Assessment: No data recorded  Collateral Involvement: No data recorded  Does Patient Have a Court Appointed Legal Guardian? No data recorded Name and Contact of Legal Guardian: No data recorded If Minor and Not Living with Parent(s), Who has Custody? No data recorded Is CPS involved or ever been involved? No data recorded Is APS involved or ever been involved? No data recorded  Patient Determined To Be At Risk for Harm To Self or Others Based on Review of Patient Reported Information or Presenting Complaint? No data recorded Method: No data recorded Availability of Means: No data recorded Intent: No data recorded Notification Required: No data recorded Additional Information for Danger to Others Potential: No data recorded Additional Comments for Danger to Others Potential: No data recorded Are There Guns or Other Weapons in Your Home? No data recorded Types of Guns/Weapons: No data recorded Are These Weapons Safely Secured?                            No data recorded Who Could Verify You Are Able To Have These Secured: No data recorded Do You Have any Outstanding Charges, Pending Court Dates, Parole/Probation? No data  recorded Contacted To Inform of Risk of Harm To Self or Others: No data recorded  Location of Assessment: No data recorded  Does Patient Present under Involuntary Commitment? No data recorded IVC Papers Initial File Date: No data recorded  [place] of Residence: No data recorded  Patient Currently Receiving the Following Services: No data recorded  Determination of Need: No data recorded  Options For Referral: No data recorded    CCA Biopsychosocial Intake/Chief Complaint:  No data recorded Current Symptoms/Problems: No data recorded  Patient Reported Schizophrenia/Schizoaffective Diagnosis in Past: No data recorded  Strengths: No data recorded Preferences: No data recorded Abilities: No data recorded  Type of Services Patient Feels are Needed: No data recorded  Initial Clinical Notes/Concerns: No data recorded  Mental Health Symptoms Depression:  No data recorded  Duration of Depressive symptoms: No data recorded  Mania:  No data recorded  Anxiety:   No data recorded  Psychosis:  No data recorded  Duration of Psychotic symptoms: No data recorded  Trauma:  No data recorded  Obsessions:  No data recorded  Compulsions:  No data recorded  Inattention:  No data recorded  Hyperactivity/Impulsivity:  No data recorded  Oppositional/Defiant Behaviors:  No data recorded  Emotional Irregularity:  No data recorded  Other Mood/Personality  Symptoms:  No data recorded   Mental Status Exam Appearance and self-care  Stature:  No data recorded  Weight:  No data recorded  Clothing:  No data recorded  Grooming:  No data recorded  Cosmetic use:  No data recorded  Posture/gait:  No data recorded  Motor activity:  No data recorded  Sensorium  Attention:  No data recorded  Concentration:  No data recorded  Orientation:  No data recorded  Recall/memory:  No data recorded  Affect and Mood  Affect:  No data recorded  Mood:  No data recorded  Relating  Eye contact:  No data  recorded  Facial expression:  No data recorded  Attitude toward examiner:  No data recorded  Thought and Language  Speech flow: No data recorded  Thought content:  No data recorded  Preoccupation:  No data recorded  Hallucinations:  No data recorded  Organization:  No data recorded  [employer] of Knowledge:  No data recorded  Intelligence:  No data recorded  Abstraction:  No data recorded  Judgement:  No data recorded  Reality Testing:  No data recorded  Insight:  No data recorded  Decision Making:  No data recorded  Social Functioning  Social Maturity:  No data recorded  Social Judgement:  No data recorded  Stress  Stressors:  No data recorded  Coping Ability:  No data recorded  Skill Deficits:  No data recorded  Supports:  No data recorded    Religion:    Leisure/Recreation:    Exercise/Diet:     CCA Employment/Education Employment/Work Situation:    Education:     CCA Family/Childhood History Family and Relationship History:    Childhood History:     Child/Adolescent Assessment:     CCA Substance Use Alcohol/Drug Use:                           ASAM's:  Six Dimensions of Multidimensional Assessment  Dimension 1:  Acute Intoxication and/or Withdrawal Potential:      Dimension 2:  Biomedical Conditions and Complications:      Dimension 3:  Emotional, Behavioral, or Cognitive Conditions and Complications:     Dimension 4:  Readiness to Change:     Dimension 5:  Relapse, Continued use, or Continued Problem Potential:     Dimension 6:  Recovery/Living Environment:     ASAM Severity Score:    ASAM Recommended Level of Treatment:     Substance use Disorder (SUD)    Recommendations for Services/Supports/Treatments:    DSM5 Diagnoses: Patient Active Problem List   Diagnosis Date Noted   Normal labor 10/18/2017   Cholelithiasis affecting pregnancy in second trimester, antepartum 05/17/2017   Nausea and vomiting  03/08/2017   Nausea and vomiting in pregnancy 03/07/2017   Symptomatic cholelithiasis 02/15/2017   Chronic cholecystitis s/p lap cholecystectomy 05/17/2017    Dysthymic disorder 03/16/2015   PTSD (post-traumatic stress disorder) 03/16/2015   ADHD (attention deficit hyperactivity disorder), combined type 03/16/2015    Patient Centered Plan: Patient is on the following Treatment Plan(s):  Depression and Post Traumatic Stress Disorder  [email or web address] @  [employer], LCAS

## 2023-06-28 DIAGNOSIS — F330 Major depressive disorder, recurrent, mild: Secondary | ICD-10-CM

## 2023-06-28 LAB — URINE CULTURE: Culture: 20000 — AB
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
## 2023-06-28 NOTE — Group Note (Signed)
 Date:  06/28/2023 Time:  11:08 AM  Group Topic/Focus:  Making Healthy Choices:   The focus of this group is to help patients identify negative/unhealthy choices they were using prior to admission and identify positive/healthier coping strategies to replace them upon discharge.    Participation Level:  Active  Participation Quality:  Appropriate  Affect:  Appropriate  Cognitive:  Appropriate  Insight: Appropriate  Engagement in Group:  Engaged  Modes of Intervention:  Activity  Additional Comments:    Jill Aguilar Marcella Dunnaway 06/28/2023, 11:08 AM

## 2023-06-28 NOTE — Progress Notes (Signed)
   06/28/23 1400  Psych Admission Type (Psych Patients Only)  Admission Status Voluntary  Psychosocial Assessment  Patient Complaints Anxiety (Patient states that she has a lot of work that needs to be done and she's the only one to do it.)  [employer];Watchful  Facial Expression Anxious;Worried  Affect Anxious;Preoccupied (patient is concerned about how long she'll be here and her job.)  Surveyor, minerals Activity Slow  Appearance/Hygiene Layered Therapist, occupational Cooperative;Appropriate to situation  Mood Anxious;Pleasant (patient's goal for today is to "learn coping mechanisms", in which she will "listen/participate", in order to achieve her goal.)  Aggressive Behavior  Effect No apparent injury  Thought Process  Coherency WDL  Content WDL  Delusions None reported or observed  Perception WDL  Hallucination None reported or observed  Judgment WDL  Confusion None  Danger to Self  Current suicidal ideation? Denies  Danger to Others  Danger to Others None reported or observed

## 2023-06-28 NOTE — Group Note (Signed)
 LCSW Group Therapy Note  Group Date: 06/28/2023 Start Time: 1400 End Time: 1500   Type of Therapy and Topic:  Group Therapy: Anger Cues and Responses  Participation Level:  Active   Description of Group:   In this group, patients learned how to recognize the physical, cognitive, emotional, and behavioral responses they have to anger-provoking situations.  They identified a recent time they became angry and how they reacted.  They analyzed how their reaction was possibly beneficial and how it was possibly unhelpful.  The group discussed a variety of healthier coping skills that could help with such a situation in the future.  Focus was placed on how helpful it is to recognize the underlying emotions to our anger, because working on those can lead to a more permanent solution as well as our ability to focus on the important rather than the urgent.  Therapeutic Goals: Patients will remember their last incident of anger and how they felt emotionally and physically, what their thoughts were at the time, and how they behaved. Patients will identify how their behavior at that time worked for them, as well as how it worked against them. Patients will explore possible new behaviors to use in future anger situations. Patients will learn that anger itself is normal and cannot be eliminated, and that healthier reactions can assist with resolving conflict rather than worsening situations.  Summary of Patient Progress:   Patient was active during the group. She shared how anger has negatively impacted her life.  She demonstrated fair insight into the subject matter, was respectful of peers, and participated throughout the entire session.  Therapeutic Modalities:   Cognitive Behavioral Therapy    Harden Mo, LCSW 06/28/2023  3:25 PM

## 2023-06-28 NOTE — Group Note (Signed)
 Date:  06/28/2023 Time:  2:19 PM  Group Topic/Focus:  Activity Group:   The focus of this group is to promote activity for the patients and encourage them to go outside to the courtyard for some fresh air and some exercise.    Participation Level:  Active  Participation Quality:  Appropriate  Affect:  Appropriate  Cognitive:  Appropriate  Insight: Appropriate  Engagement in Group:  Engaged  Modes of Intervention:  Activity  Additional Comments:    Jill Aguilar 06/28/2023, 2:19 PM
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
## 2023-06-28 NOTE — Progress Notes (Signed)
   06/28/23 0200  Psych Admission Type (Psych Patients Only)  Admission Status Voluntary  Psychosocial Assessment  Patient Complaints Anxiety  Eye Contact Fair  Facial Expression Sad  Affect Depressed  Speech Logical/coherent  Interaction Isolative  Motor Activity Slow  Appearance/Hygiene In scrubs  Behavior Characteristics Cooperative  Mood Depressed;Anxious  Thought Process  Coherency WDL  Content WDL  Delusions None reported or observed  Perception WDL  Hallucination None reported or observed  Judgment Impaired  Confusion WDL  Danger to Self  Current suicidal ideation? Denies  Danger to Others  Danger to Others None reported or observed

## 2023-06-28 NOTE — Plan of Care (Signed)
  Problem: Education: Goal: Knowledge of [facility] Education information/materials will improve Outcome: Progressing Goal: Emotional status will improve Outcome: Progressing Goal: Mental status will improve Outcome: Progressing Goal: Verbalization of understanding the information provided will improve Outcome: Progressing   Problem: Activity: Goal: Interest or engagement in activities will improve Outcome: Progressing Goal: Sleeping patterns will improve Outcome: Progressing   Problem: Coping: Goal: Ability to verbalize frustrations and anger appropriately will improve Outcome: Progressing Goal: Ability to demonstrate self-control will improve Outcome: Progressing   Problem: Health Behavior/Discharge Planning: Goal: Identification of resources available to assist in meeting health care needs will improve Outcome: Progressing Goal: Compliance with treatment plan for underlying cause of condition will improve Outcome: Progressing   Problem: Physical Regulation: Goal: Ability to maintain clinical measurements within normal limits will improve Outcome: Progressing   Problem: Safety: Goal: Periods of time without injury will increase Outcome: Progressing   Problem: Education: Goal: Utilization of techniques to improve thought processes will improve Outcome: Progressing   Problem: Coping: Goal: Coping ability will improve Outcome: Progressing Goal: Will verbalize feelings Outcome: Progressing   Problem: Health Behavior/Discharge Planning: Goal: Ability to make decisions will improve Outcome: Progressing   Problem: Self-Concept: Goal: Will verbalize positive feelings about self Outcome: Progressing

## 2023-06-28 NOTE — Group Note (Signed)
 Date:  06/28/2023 Time:  9:01 PM  Group Topic/Focus:  Wellness Toolbox:   The focus of this group is to discuss various aspects of wellness, balancing those aspects and exploring ways to increase the ability to experience wellness.  Patients will create a wellness toolbox for use upon discharge.    Participation Level:  Active  Participation Quality:  Appropriate  Affect:  Appropriate  Cognitive:  Appropriate  Insight: Appropriate  Engagement in Group:  Engaged  Modes of Intervention:  Discussion  Additional Comments:  n/a  Lorenda Ishihara 06/28/2023, 9:01 PM

## 2023-06-29 LAB — LIPID PANEL
Cholesterol: 145 mg/dL (ref 0–200)
HDL: 40 mg/dL — ABNORMAL LOW (ref >40)
LDL Cholesterol: 88 mg/dL (ref 0–99)
Total CHOL/HDL Ratio: 3.6 ratio
Triglycerides: 85 mg/dL (ref <150)
VLDL: 17 mg/dL (ref 0–40)

## 2023-06-29 LAB — HEMOGLOBIN A1C
Hgb A1c MFr Bld: 5.5 % (ref 4.8–5.6)
Mean Plasma Glucose: 111.15 mg/dL

## 2023-06-29 MED ORDER — LAMOTRIGINE 25 MG PO TABS
25.0000 mg | ORAL_TABLET | Freq: Every day | ORAL | Status: DC
  Administered 2023-06-29 – 2023-06-30 (×2): 25 mg via ORAL
  Filled 2023-06-29 (×2): qty 1

## 2023-06-29 MED ORDER — MELATONIN 5 MG PO TABS
5.0000 mg | ORAL_TABLET | Freq: Every day | ORAL | Status: DC
  Filled 2023-06-29: qty 1

## 2023-06-29 NOTE — Group Note (Signed)
 Date:  06/29/2023 Time:  10:25 AM  Group Topic/Focus:  Goals Group:   The focus of this group is to help patients establish daily goals to achieve during treatment and discuss how the patient can incorporate goal setting into their daily lives to aide in recovery.    Participation Level:  Did Not Attend  Participation Quality:    Affect:    Cognitive:    Insight:   Engagement in Group:    Modes of Intervention:    Additional Comments:    Jill Aguilar 06/29/2023, 10:25 AM

## 2023-06-29 NOTE — Group Note (Signed)
 Date:  06/29/2023 Time:  9:27 PM  Group Topic/Focus:  Making Healthy Choices:   The focus of this group is to help patients identify negative/unhealthy choices they were using prior to admission and identify positive/healthier coping strategies to replace them upon discharge. Managing Feelings:   The focus of this group is to identify what feelings patients have difficulty handling and develop a plan to handle them in a healthier way upon discharge. Wrap-Up Group:   The focus of this group is to help patients review their daily goal of treatment and discuss progress on daily workbooks.    Participation Level:  Active  Participation Quality:  Appropriate  Affect:  Appropriate  Cognitive:  Appropriate  Insight: Appropriate  Engagement in Group:  None  Modes of Intervention:  Discussion and Support  Additional Comments:  n/a  Lorenda Ishihara 06/29/2023, 9:27 PM

## 2023-06-29 NOTE — Plan of Care (Signed)
  Problem: Education: Goal: Knowledge of [facility] Education information/materials will improve Outcome: Progressing Goal: Emotional status will improve Outcome: Progressing Goal: Mental status will improve Outcome: Progressing Goal: Verbalization of understanding the information provided will improve Outcome: Progressing   Problem: Activity: Goal: Interest or engagement in activities will improve Outcome: Progressing Goal: Sleeping patterns will improve Outcome: Progressing   Problem: Coping: Goal: Ability to verbalize frustrations and anger appropriately will improve Outcome: Progressing Goal: Ability to demonstrate self-control will improve Outcome: Progressing   Problem: Health Behavior/Discharge Planning: Goal: Identification of resources available to assist in meeting health care needs will improve Outcome: Progressing Goal: Compliance with treatment plan for underlying cause of condition will improve Outcome: Progressing   Problem: Physical Regulation: Goal: Ability to maintain clinical measurements within normal limits will improve Outcome: Progressing   Problem: Safety: Goal: Periods of time without injury will increase Outcome: Progressing   Problem: Education: Goal: Utilization of techniques to improve thought processes will improve Outcome: Progressing   Problem: Coping: Goal: Coping ability will improve Outcome: Progressing Goal: Will verbalize feelings Outcome: Progressing   Problem: Health Behavior/Discharge Planning: Goal: Ability to make decisions will improve Outcome: Progressing   Problem: Self-Concept: Goal: Will verbalize positive feelings about self Outcome: Progressing

## 2023-06-29 NOTE — Plan of Care (Signed)
  Problem: Education: Goal: Mental status will improve Outcome: Progressing   Problem: Education: Goal: Emotional status will improve Outcome: Progressing   Problem: Activity: Goal: Interest or engagement in activities will improve Outcome: Progressing   Problem: Activity: Goal: Sleeping patterns will improve Outcome: Progressing
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
## 2023-06-29 NOTE — BH IP Treatment Plan (Signed)
 Interdisciplinary Treatment and Diagnostic Plan Update  06/29/2023 Time of Session: 10:22 Jill Aguilar MRN: [id number]  Principal Diagnosis: MDD (major depressive disorder)  Secondary Diagnoses: Principal Problem:   MDD (major depressive disorder)   Current Medications:  Current Facility-Administered Medications  Medication Dose Route Frequency Provider Last Rate Last Admin   acetaminophen (TYLENOL) tablet 650 mg  650 mg Oral Q6H PRN Tingling, Stephanie, PA-C       alum & mag hydroxide-simeth (MAALOX/MYLANTA) 200-200-20 MG/5ML suspension 30 mL  30 mL Oral Q4H PRN Tingling, Stephanie, PA-C       clobetasol ointment (TEMOVATE) 0.05 %   Topical BID Tingling, Stephanie, PA-C   Given at 06/29/23 4098   haloperidol (HALDOL) tablet 5 mg  5 mg Oral TID PRN Tingling, Stephanie, PA-C       And   diphenhydrAMINE (BENADRYL) capsule 50 mg  50 mg Oral TID PRN Tingling, Stephanie, PA-C       haloperidol lactate (HALDOL) injection 5 mg  5 mg Intramuscular TID PRN Tingling, Stephanie, PA-C       And   diphenhydrAMINE (BENADRYL) injection 50 mg  50 mg Intramuscular TID PRN Tingling, Stephanie, PA-C       And   LORazepam (ATIVAN) injection 2 mg  2 mg Intramuscular TID PRN Tingling, Stephanie, PA-C       haloperidol lactate (HALDOL) injection 10 mg  10 mg Intramuscular TID PRN Tingling, Stephanie, PA-C       And   diphenhydrAMINE (BENADRYL) injection 50 mg  50 mg Intramuscular TID PRN Tingling, Stephanie, PA-C       And   LORazepam (ATIVAN) injection 2 mg  2 mg Intramuscular TID PRN Tingling, Stephanie, PA-C       doxycycline (VIBRA-TABS) tablet 100 mg  100 mg Oral Q12H Tingling, Stephanie, PA-C   100 mg at 06/29/23 1191   hydrOXYzine (ATARAX) tablet 25 mg  25 mg Oral TID PRN Tingling, Stephanie, PA-C   25 mg at 06/28/23 1317   magnesium hydroxide (MILK OF MAGNESIA) suspension 30 mL  30 mL Oral Daily PRN Tingling, Stephanie, PA-C       traZODone (DESYREL) tablet 50 mg  50 mg Oral QHS PRN  Tingling, Stephanie, PA-C   50 mg at 06/28/23 2149   PTA Medications: Medications Prior to Admission  Medication Sig Dispense Refill Last Dose/Taking   cyclobenzaprine (FLEXERIL) 10 MG tablet Take 10 mg by mouth 3 (three) times daily as needed for muscle spasms.   Taking As Needed   clobetasol ointment (TEMOVATE) 0.05 % Apply 1 Application topically 2 (two) times daily. (Patient not taking: Reported on 06/27/2023) 30 g 1 Not Taking    Patient Stressors: Marital or family conflict   Substance abuse   Traumatic event    Patient Strengths: Average or above average intelligence  Capable of independent living  Communication skills  Motivation for treatment/growth  Physical Health  Work skills   Treatment Modalities: Medication Management, Group therapy, Case management,  1 to 1 session with clinician, Psychoeducation, Recreational therapy.   Physician Treatment Plan for Primary Diagnosis: MDD (major depressive disorder) Long Term Goal(s):     Short Term Goals:    Medication Management: Evaluate patient's response, side effects, and tolerance of medication regimen.  Therapeutic Interventions: 1 to 1 sessions, Unit Group sessions and Medication administration.  Evaluation of Outcomes: Progressing  Physician Treatment Plan for Secondary Diagnosis: Principal Problem:   MDD (major depressive disorder)  Long Term Goal(s):     Short Term  Goals:       Medication Management: Evaluate patient's response, side effects, and tolerance of medication regimen.  Therapeutic Interventions: 1 to 1 sessions, Unit Group sessions and Medication administration.  Evaluation of Outcomes: Progressing   RN Treatment Plan for Primary Diagnosis: MDD (major depressive disorder) Long Term Goal(s): Knowledge of disease and therapeutic regimen to maintain health will improve  Short Term Goals: Ability to remain free from injury will improve, Ability to verbalize frustration and anger appropriately will  improve, Ability to demonstrate self-control, Ability to participate in decision making will improve, Ability to verbalize feelings will improve, Ability to disclose and discuss suicidal ideas, Ability to identify and develop effective coping behaviors will improve, and Compliance with prescribed medications will improve  Medication Management: RN will administer medications as ordered by provider, will assess and evaluate patient's response and provide education to patient for prescribed medication. RN will report any adverse and/or side effects to prescribing provider.  Therapeutic Interventions: 1 on 1 counseling sessions, Psychoeducation, Medication administration, Evaluate responses to treatment, Monitor vital signs and CBGs as ordered, Perform/monitor CIWA, COWS, AIMS and Fall Risk screenings as ordered, Perform wound care treatments as ordered.  Evaluation of Outcomes: Progressing   LCSW Treatment Plan for Primary Diagnosis: MDD (major depressive disorder) Long Term Goal(s): Safe transition to appropriate next level of care at discharge, Engage patient in therapeutic group addressing interpersonal concerns.  Short Term Goals: Engage patient in aftercare planning with referrals and resources, Increase social support, Increase ability to appropriately verbalize feelings, Increase emotional regulation, Facilitate acceptance of mental health diagnosis and concerns, Facilitate patient progression through stages of change regarding substance use diagnoses and concerns, Identify triggers associated with mental health/substance abuse issues, and Increase skills for wellness and recovery  Therapeutic Interventions: Assess for all discharge needs, 1 to 1 time with Social worker, Explore available resources and support systems, Assess for adequacy in community support network, Educate family and significant other(s) on suicide prevention, Complete Psychosocial Assessment, Interpersonal group  therapy.  Evaluation of Outcomes: Progressing   Progress in Treatment: Attending groups: Yes. Participating in groups: Yes. Taking medication as prescribed: Yes. Toleration medication: Yes. Family/Significant other contact made: No, will contact:  if given permission.  Patient understands diagnosis: Yes. Discussing patient identified problems/goals with staff: Yes. Medical problems stabilized or resolved: Yes. Denies suicidal/homicidal ideation: Yes. Issues/concerns per patient self-inventory: No. Other: none.   New problem(s) identified: No, Describe:  none identified.  New Short Term/Long Term Goal(s): medication management for mood stabilization; elimination of SI thoughts; development of comprehensive mental wellness/sobriety plan.  Patient Goals:  "To find coping skills to help with the stress."   Discharge Plan or Barriers: CSW will assist pt with development of an appropriate aftercare/discharge plan.   Reason for Continuation of Hospitalization: Anxiety Medication stabilization  Estimated Length of Stay: 1-7 days  Last 3 [place] Suicide Severity Risk Score: Flowsheet Row Admission (Current) from 06/27/2023 in [facility]PATIENT BEH[facility] ED from 06/26/2023 in [facility] Emergency Department at [facility] ED from 04/08/2023 in [facility] Emergency Department at [facility]  C-SSRS RISK CATEGORY No Risk No Risk No Risk       Last PHQ 2/9 Scores:     No data to display          Scribe for Treatment Team: Glenis Smoker, LCSW 06/29/2023 10:56 AM

## 2023-06-29 NOTE — Progress Notes (Signed)
   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 Denies  Danger to Others  Danger to Others None reported or observed

## 2023-06-30 DIAGNOSIS — F320 Major depressive disorder, single episode, mild: Secondary | ICD-10-CM

## 2023-06-30 MED ORDER — LAMOTRIGINE 25 MG PO TABS: 25.0000 mg | ORAL_TABLET | Freq: Every day | ORAL | 0 refills | Status: AC

## 2023-06-30 MED ORDER — DOXYCYCLINE HYCLATE 100 MG PO TABS: 100.0000 mg | ORAL_TABLET | Freq: Two times a day (BID) | ORAL | 0 refills | Status: AC

## 2023-06-30 MED ORDER — HYDROXYZINE HCL 25 MG PO TABS: 25.0000 mg | ORAL_TABLET | Freq: Three times a day (TID) | ORAL | 0 refills | Status: AC | PRN

## 2023-06-30 MED ORDER — CLOBETASOL PROPIONATE 0.05 % EX OINT: TOPICAL_OINTMENT | Freq: Two times a day (BID) | CUTANEOUS | 0 refills | Status: AC

## 2023-06-30 MED ORDER — MELATONIN 5 MG PO TABS: 5.0000 mg | ORAL_TABLET | Freq: Every day | ORAL | 0 refills | Status: AC

## 2023-06-30 NOTE — Group Note (Signed)
 Date:  06/30/2023 Time:  10:24 AM  Group Topic/Focus:  Making Healthy Choices:   The focus of this group is to help patients identify negative/unhealthy choices they were using prior to admission and identify positive/healthier coping strategies to replace them upon discharge.    Participation Level:  Active  Participation Quality:  Appropriate  Affect:  Appropriate  Cognitive:  Appropriate  Insight: Appropriate  Engagement in Group:  Engaged  Modes of Intervention:  Discussion, Education, and Support  Additional Comments:    Wilford Corner 06/30/2023, 10:24 AM

## 2023-06-30 NOTE — [facility] Suicide Risk Assessment (Signed)
 Suicide Risk Assessment  Discharge Assessment    [facility] Discharge Suicide Risk Assessment   Principal Problem: MDD (major depressive disorder) Discharge Diagnoses: Principal Problem:   MDD (major depressive disorder)   Total Time spent with patient: 45 minutes  Musculoskeletal: Strength & Muscle Tone: within normal limits Gait & Station: normal Patient leans: N/A  Psychiatric Specialty Exam  Presentation  General Appearance:  Appropriate for Environment  Eye Contact: Good  Speech: Clear and Coherent; Normal Rate  Speech Volume: Normal  Handedness:No data recorded  Mood and Affect  Mood: Anxious  Duration of Depression Symptoms: Greater than two weeks  Affect: Appropriate   Thought Process  Thought Processes: Coherent; Goal Directed  Descriptions of Associations:Intact  Orientation:Full (Time, Place and Person)  Thought Content:Logical  History of Schizophrenia/Schizoaffective disorder:No  Duration of Psychotic Symptoms:No data recorded Hallucinations:Hallucinations: None  Ideas of Reference:None  Suicidal Thoughts:Suicidal Thoughts: No  Homicidal Thoughts:Homicidal Thoughts: No   Sensorium  Memory: Immediate Good; Recent Good; Remote Good  Judgment: Fair  Insight: Fair   Art therapist  Concentration: Good  Attention Span: Good  Recall: Good  Fund of Knowledge: Good  Language: Good   Psychomotor Activity  Psychomotor Activity: Psychomotor Activity: Normal   Assets  Assets: Communication Skills; Desire for Improvement; Financial Resources/Insurance; Housing; Intimacy; Physical Health; Social Support; Transportation   Sleep  Sleep: Sleep: Good   Physical Exam: Physical Exam Vitals and nursing note reviewed.  Constitutional:      Appearance: Normal appearance.  HENT:     Head: Normocephalic and atraumatic.     Nose: Nose normal.     Mouth/Throat:     Mouth: Mucous membranes are moist.  Eyes:      Extraocular Movements: Extraocular movements intact.  Pulmonary:     Effort: Pulmonary effort is normal.  Musculoskeletal:        General: Normal range of motion.     Cervical back: Normal range of motion.  Skin:    General: Skin is warm and dry.  Neurological:     General: No focal deficit present.     Mental Status: She is alert and oriented to person, place, and time. Mental status is at baseline.  Psychiatric:        Attention and Perception: Attention and perception normal.        Mood and Affect: Affect normal. Mood is anxious.        Speech: Speech normal.        Behavior: Behavior normal. Behavior is cooperative.        Thought Content: Thought content normal.        Cognition and Memory: Cognition and memory normal.        Judgment: Judgment is impulsive.    Review of Systems  Psychiatric/Behavioral:  Positive for substance abuse. The patient is nervous/anxious.   All other systems reviewed and are negative.  Blood pressure 107/76, pulse 78, temperature (!) 97.5 F (36.4 C), resp. rate 18, height 5\' 7"  (1.702 m), weight 87.1 kg, last menstrual period 06/15/2023, SpO2 100%. Body mass index is 30.07 kg/m.  Mental Status Per Nursing Assessment::   On Admission:  NA  Demographic Factors:  NA  Loss Factors: NA  Historical Factors: Family history of mental illness or substance abuse, Impulsivity, and substance use  Risk Reduction Factors:   Responsible for children under 68 years of age, Sense of responsibility to family, Employed, Living with another person, especially a relative, and Positive social support  Continued Clinical Symptoms:  Alcohol/Substance Abuse/Dependencies  Cognitive Features That Contribute To Risk:  None    Suicide Risk:  Minimal: No identifiable suicidal ideation.  Patients presenting with no risk factors but with morbid ruminations; may be classified as minimal risk based on the severity of the depressive symptoms   Follow-up Information      [facility], [facility]. Go to.   Why: In person appointment 07/06/23 at 10 AM. Contact information: [address] [place] [address] [phone]                 Plan Of Care/Follow-up recommendations:  # It is recommended to the patient to continue psychiatric medications as prescribed, after discharge from the hospital.   # It is recommended to the patient to follow up with your outpatient psychiatric provider and PCP. # It was discussed with the patient, the impact of alcohol, drugs, tobacco have been there overall psychiatric and medical wellbeing, and total abstinence from substance use was recommended. # Prescriptions provided or sent directly to preferred pharmacy at discharge. Patient agreeable to plan. Given the opportunity to ask questions. Appears to feel comfortable with discharge.  # In the event of worsening symptoms, the patient is instructed to call the crisis hotline (988), 911 and or go to the nearest ED for appropriate evaluation and treatment of symptoms. To follow-up with primary care provider for other medical issues, concerns and or health care needs # Patient was discharged back home with a plan to follow up as noted above.    Khianna Blazina, PA-C 06/30/2023, 9:15 AM

## 2023-06-30 NOTE — Plan of Care (Signed)
  Problem: Education: Goal: Knowledge of [place] [facility] Education information/materials will improve Outcome: Adequate for Discharge Goal: Emotional status will improve Outcome: Adequate for Discharge Goal: Mental status will improve Outcome: Adequate for Discharge Goal: Verbalization of understanding the information provided will improve Outcome: Adequate for Discharge   Problem: Activity: Goal: Interest or engagement in activities will improve Outcome: Adequate for Discharge Goal: Sleeping patterns will improve Outcome: Adequate for Discharge   Problem: Coping: Goal: Ability to verbalize frustrations and anger appropriately will improve Outcome: Adequate for Discharge Goal: Ability to demonstrate self-control will improve Outcome: Adequate for Discharge   Problem: Health Behavior/Discharge Planning: Goal: Identification of resources available to assist in meeting health care needs will improve Outcome: Adequate for Discharge Goal: Compliance with treatment plan for underlying cause of condition will improve Outcome: Adequate for Discharge   Problem: Physical Regulation: Goal: Ability to maintain clinical measurements within normal limits will improve Outcome: Adequate for Discharge   Problem: Safety: Goal: Periods of time without injury will increase Outcome: Adequate for Discharge   Problem: Education: Goal: Utilization of techniques to improve thought processes will improve Outcome: Adequate for Discharge   Problem: Coping: Goal: Coping ability will improve Outcome: Adequate for Discharge Goal: Will verbalize feelings Outcome: Adequate for Discharge   Problem: Health Behavior/Discharge Planning: Goal: Ability to make decisions will improve Outcome: Adequate for Discharge   Problem: Self-Concept: Goal: Will verbalize positive feelings about self Outcome: Adequate for Discharge

## 2023-06-30 NOTE — Progress Notes (Signed)
  [facility] Adult Case Management Discharge Plan :  Will you be returning to the same living situation after discharge:  No. Patient will be going to mothers home At discharge, do you have transportation home?: Yes,  patient drove herself to the ED Do you have the ability to pay for your medications: Yes,  Patient reports that she has money and insurance  Release of information consent forms completed and in the chart;  Patient's signature needed at discharge.  Patient to Follow up at:  Follow-up Information     [facility], [facility]. Go to.   Why: In person appointment 07/06/23 at 10 AM. Contact information: [address] [place] [address] [phone]                 Next level of care provider has access to [facility] Link:yes  Safety Planning and Suicide Prevention discussed: Yes,  completed with mother Marlene Lard [phone]     Has patient been referred to the Quitline?: Yes, faxed/e-referral on 06/30/2023  Patient has been referred for addiction treatment: No known substance use disorder.  Starleen Arms, LCSW 06/30/2023, 11:37 AM

## 2023-06-30 NOTE — [facility] Suicide Risk Assessment (Signed)
 [facility] INPATIENT:  Family/Significant Other Suicide Prevention Education  Suicide Prevention Education:  Education Completed; Marlene Lard Mother [phone],  been identified by the patient as the family member/significant other with whom the patient will be residing, and identified as the person(s) who will aid the patient in the event of a mental health crisis (suicidal ideations/suicide attempt).  With written consent from the patient, the family member/significant other has been provided the following suicide prevention education, prior to the and/or following the discharge of the patient.  The suicide prevention education provided includes the following: Suicide risk factors Suicide prevention and interventions National Suicide Hotline telephone number [facility] assessment telephone number [facility] Emergency Assistance 911 [facility] and/or Residential Mobile Crisis Unit telephone number  Request made of family/significant other to: Remove weapons (e.g., guns, rifles, knives), all items previously/currently identified as safety concern.   Remove drugs/medications (over-the-counter, prescriptions, illicit drugs), all items previously/currently identified as a safety concern.  The family member/significant other verbalizes understanding of the suicide prevention education information provided.  The family member/significant other agrees to remove the items of safety concern listed above.  Starleen Arms 06/30/2023, 11:35 AM

## 2023-06-30 NOTE — Progress Notes (Signed)
   06/30/23 1100  Psych Admission Type (Psych Patients Only)  Admission Status Voluntary  Psychosocial Assessment  Patient Complaints Anxiety (patient states that she is anxious to go home today.)  Eye Contact Fair;Watchful  Facial Expression Anxious  Affect Anxious;Preoccupied  Speech Logical/coherent  Interaction Assertive  Motor Activity Slow  Appearance/Hygiene Layered clothes;Unremarkable  Behavior Characteristics Cooperative;Appropriate to situation  Mood Pleasant (patient's goal for today is "doing what it takes to get out".)  Aggressive Behavior  Effect No apparent injury  Thought Process  Coherency WDL  Content WDL  Delusions None reported or observed  Perception WDL  Hallucination None reported or observed  Judgment WDL  Confusion None  Danger to Self  Current suicidal ideation? Denies  Danger to Others  Danger to Others None reported or observed

## 2023-06-30 NOTE — Progress Notes (Signed)
   06/30/23 0300  Psych Admission Type (Psych Patients Only)  Admission Status Voluntary  Psychosocial Assessment  Patient Complaints Anxiety  Eye Contact Fair  Facial Expression Anxious  Affect Preoccupied  Speech Logical/coherent  Interaction Assertive  Motor Activity Slow  Appearance/Hygiene Unremarkable  Behavior Characteristics Cooperative  Mood Pleasant  Thought Process  Coherency WDL  Content WDL  Delusions None reported or observed  Perception WDL  Hallucination None reported or observed  Judgment WDL  Confusion None  Danger to Self  Current suicidal ideation? Denies  Danger to Others  Danger to Others None reported or observed
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
## 2023-06-30 NOTE — Progress Notes (Signed)
 Patient ID: Jill Aguilar, female   DOB: October 12, 1990, 33 y.o.   MRN: [id number]  Discharge Note:  Patient denies SI/HI/AVH at this time. Discharge instructions, AVS, prescriptions, and transition record gone over with patient. Patient given a copy of her Suicide Safety Plan. Patient agrees to comply with medication management, follow-up visit, and outpatient therapy. Patient belongings returned to patient. Patient questions and concerns addressed and answered. Patient ambulatory off unit. Patient discharged to [facility] parking lot, where her personal vehicle is.
